# Patient Record
Sex: Male | Born: 1975 | Race: Asian | Hispanic: No | Marital: Married | Smoking: Never smoker
Health system: Southern US, Community
[De-identification: ages and names within clinical notes are randomized; demographics above are authoritative.]

## PROBLEM LIST (undated history)

## (undated) DIAGNOSIS — M542 Cervicalgia: Secondary | ICD-10-CM

## (undated) DIAGNOSIS — Z86010 Personal history of colonic polyps: Secondary | ICD-10-CM

## (undated) DIAGNOSIS — E119 Type 2 diabetes mellitus without complications: Secondary | ICD-10-CM

## (undated) DIAGNOSIS — R569 Unspecified convulsions: Secondary | ICD-10-CM

## (undated) DIAGNOSIS — I2510 Atherosclerotic heart disease of native coronary artery without angina pectoris: Secondary | ICD-10-CM

## (undated) DIAGNOSIS — R1032 Left lower quadrant pain: Secondary | ICD-10-CM

## (undated) DIAGNOSIS — N200 Calculus of kidney: Secondary | ICD-10-CM

## (undated) DIAGNOSIS — K219 Gastro-esophageal reflux disease without esophagitis: Secondary | ICD-10-CM

## (undated) DIAGNOSIS — R1319 Other dysphagia: Secondary | ICD-10-CM

## (undated) DIAGNOSIS — R7302 Impaired glucose tolerance (oral): Secondary | ICD-10-CM

## (undated) DIAGNOSIS — N529 Male erectile dysfunction, unspecified: Secondary | ICD-10-CM

## (undated) DIAGNOSIS — R030 Elevated blood-pressure reading, without diagnosis of hypertension: Secondary | ICD-10-CM

## (undated) DIAGNOSIS — E785 Hyperlipidemia, unspecified: Secondary | ICD-10-CM

## (undated) DIAGNOSIS — K921 Melena: Secondary | ICD-10-CM

## (undated) DIAGNOSIS — R51 Headache: Secondary | ICD-10-CM

## (undated) DIAGNOSIS — J309 Allergic rhinitis, unspecified: Secondary | ICD-10-CM

## (undated) HISTORY — DX: Atherosclerotic heart disease of native coronary artery without angina pectoris: I25.10

## (undated) HISTORY — DX: Male erectile dysfunction, unspecified: N52.9

## (undated) HISTORY — DX: Hyperlipidemia, unspecified: E78.5

## (undated) HISTORY — DX: Headache: R51

## (undated) HISTORY — DX: Left lower quadrant pain: R10.32

## (undated) HISTORY — PX: OTHER SURGICAL HISTORY: SHX169

## (undated) HISTORY — DX: Other dysphagia: R13.19

## (undated) HISTORY — DX: Elevated blood-pressure reading, without diagnosis of hypertension: R03.0

## (undated) HISTORY — PX: NASAL FRACTURE SURGERY: SHX718

## (undated) HISTORY — DX: Cervicalgia: M54.2

## (undated) HISTORY — PX: ANAL FISSURE REPAIR: SHX2312

## (undated) HISTORY — DX: Unspecified convulsions: R56.9

## (undated) HISTORY — DX: Impaired glucose tolerance (oral): R73.02

## (undated) HISTORY — DX: Melena: K92.1

## (undated) HISTORY — PX: WISDOM TOOTH EXTRACTION: SHX21

## (undated) HISTORY — DX: Personal history of colonic polyps: Z86.010

## (undated) HISTORY — DX: Gastro-esophageal reflux disease without esophagitis: K21.9

## (undated) HISTORY — DX: Allergic rhinitis, unspecified: J30.9

## (undated) HISTORY — DX: Calculus of kidney: N20.0

---

## 2003-04-13 ENCOUNTER — Encounter (INDEPENDENT_AMBULATORY_CARE_PROVIDER_SITE_OTHER): Payer: Self-pay | Admitting: Specialist

## 2003-04-13 ENCOUNTER — Ambulatory Visit (HOSPITAL_BASED_OUTPATIENT_CLINIC_OR_DEPARTMENT_OTHER): Admission: RE | Admit: 2003-04-13 | Discharge: 2003-04-13 | Payer: Self-pay | Admitting: General Surgery

## 2003-04-13 ENCOUNTER — Encounter (INDEPENDENT_AMBULATORY_CARE_PROVIDER_SITE_OTHER): Payer: Self-pay | Admitting: *Deleted

## 2003-04-13 ENCOUNTER — Ambulatory Visit (HOSPITAL_COMMUNITY): Admission: RE | Admit: 2003-04-13 | Discharge: 2003-04-13 | Payer: Self-pay | Admitting: General Surgery

## 2004-12-26 ENCOUNTER — Ambulatory Visit: Payer: Self-pay | Admitting: Internal Medicine

## 2006-01-01 ENCOUNTER — Ambulatory Visit (HOSPITAL_BASED_OUTPATIENT_CLINIC_OR_DEPARTMENT_OTHER): Admission: RE | Admit: 2006-01-01 | Discharge: 2006-01-01 | Payer: Self-pay | Admitting: Otolaryngology

## 2006-05-09 ENCOUNTER — Ambulatory Visit: Payer: Self-pay | Admitting: Internal Medicine

## 2006-05-09 LAB — CONVERTED CEMR LAB
Albumin: 4.1 g/dL (ref 3.5–5.2)
Alkaline Phosphatase: 55 units/L (ref 39–117)
BUN: 8 mg/dL (ref 6–23)
Basophils Absolute: 0 10*3/uL (ref 0.0–0.1)
Bilirubin Urine: NEGATIVE
Cholesterol: 219 mg/dL (ref 0–200)
Direct LDL: 154.5 mg/dL
Eosinophils Absolute: 0.3 10*3/uL (ref 0.0–0.6)
GFR calc Af Amer: 145 mL/min
HDL: 42.3 mg/dL (ref >39.0)
Hemoglobin, Urine: NEGATIVE
Hemoglobin: 13.3 g/dL (ref 13.0–17.0)
Leukocytes, UA: NEGATIVE
Lymphocytes Relative: 32.2 % (ref 12.0–46.0)
MCHC: 34.5 g/dL (ref 30.0–36.0)
MCV: 87.5 fL (ref 78.0–100.0)
Monocytes Absolute: 0.3 10*3/uL (ref 0.2–0.7)
Monocytes Relative: 7 % (ref 3.0–11.0)
Neutro Abs: 1.8 10*3/uL (ref 1.4–7.7)
Potassium: 4.3 meq/L (ref 3.5–5.1)
TSH: 0.96 microintl units/mL (ref 0.35–5.50)
VLDL: 38 mg/dL (ref 0–40)
pH: 7 (ref 5.0–8.0)

## 2006-05-15 ENCOUNTER — Ambulatory Visit: Payer: Self-pay | Admitting: Internal Medicine

## 2007-03-21 ENCOUNTER — Ambulatory Visit: Payer: Self-pay | Admitting: Internal Medicine

## 2007-03-21 DIAGNOSIS — E785 Hyperlipidemia, unspecified: Secondary | ICD-10-CM

## 2007-03-21 DIAGNOSIS — R569 Unspecified convulsions: Secondary | ICD-10-CM

## 2007-03-21 HISTORY — DX: Unspecified convulsions: R56.9

## 2007-03-21 HISTORY — DX: Hyperlipidemia, unspecified: E78.5

## 2007-10-22 ENCOUNTER — Ambulatory Visit: Payer: Self-pay | Admitting: Internal Medicine

## 2007-10-22 DIAGNOSIS — K219 Gastro-esophageal reflux disease without esophagitis: Secondary | ICD-10-CM

## 2007-10-22 DIAGNOSIS — R030 Elevated blood-pressure reading, without diagnosis of hypertension: Secondary | ICD-10-CM | POA: Insufficient documentation

## 2007-10-22 HISTORY — DX: Gastro-esophageal reflux disease without esophagitis: K21.9

## 2007-10-22 HISTORY — DX: Elevated blood-pressure reading, without diagnosis of hypertension: R03.0

## 2007-10-23 LAB — CONVERTED CEMR LAB
Albumin: 4.4 g/dL (ref 3.5–5.2)
BUN: 9 mg/dL (ref 6–23)
Bilirubin Urine: NEGATIVE
Calcium: 9.2 mg/dL (ref 8.4–10.5)
Cholesterol: 177 mg/dL (ref 0–200)
Creatinine, Ser: 0.8 mg/dL (ref 0.4–1.5)
Eosinophils Absolute: 0.2 10*3/uL (ref 0.0–0.7)
Eosinophils Relative: 3.7 % (ref 0.0–5.0)
GFR calc Af Amer: 144 mL/min
GFR calc non Af Amer: 119 mL/min
Glucose, Bld: 115 mg/dL — ABNORMAL HIGH (ref 70–99)
HCT: 45.6 % (ref 39.0–52.0)
HDL: 40.8 mg/dL (ref >39.0)
Hemoglobin: 15.3 g/dL (ref 13.0–17.0)
Hgb A1c MFr Bld: 5.8 % (ref 4.6–6.0)
MCV: 88.4 fL (ref 78.0–100.0)
Monocytes Absolute: 0.2 10*3/uL (ref 0.1–1.0)
Monocytes Relative: 3.3 % (ref 3.0–12.0)
Neutro Abs: 3.7 10*3/uL (ref 1.4–7.7)
Platelets: 227 10*3/uL (ref 150–400)
Potassium: 4.4 meq/L (ref 3.5–5.1)
TSH: 0.98 microintl units/mL (ref 0.35–5.50)
Total Protein, Urine: NEGATIVE mg/dL
Total Protein: 7.3 g/dL (ref 6.0–8.3)
Urine Glucose: NEGATIVE mg/dL
VLDL: 20 mg/dL (ref 0–40)
WBC: 5.2 10*3/uL (ref 4.5–10.5)
pH: 5.5 (ref 5.0–8.0)

## 2007-12-12 ENCOUNTER — Ambulatory Visit: Payer: Self-pay | Admitting: Internal Medicine

## 2008-01-26 ENCOUNTER — Ambulatory Visit: Payer: Self-pay | Admitting: Internal Medicine

## 2008-01-26 DIAGNOSIS — N529 Male erectile dysfunction, unspecified: Secondary | ICD-10-CM

## 2008-01-26 HISTORY — DX: Male erectile dysfunction, unspecified: N52.9

## 2008-01-28 LAB — CONVERTED CEMR LAB
ALT: 17 units/L (ref 0–53)
AST: 22 units/L (ref 0–37)
Basophils Relative: 0.7 % (ref 0.0–3.0)
Bilirubin Urine: NEGATIVE
Bilirubin, Direct: 0.1 mg/dL (ref 0.0–0.3)
CO2: 30 meq/L (ref 19–32)
Chloride: 101 meq/L (ref 96–112)
Creatinine, Ser: 0.9 mg/dL (ref 0.4–1.5)
Glucose, Bld: 106 mg/dL — ABNORMAL HIGH (ref 70–99)
Ketones, ur: NEGATIVE mg/dL
Leukocytes, UA: NEGATIVE
Lymphocytes Relative: 24.6 % (ref 12.0–46.0)
MCHC: 33.8 g/dL (ref 30.0–36.0)
MCV: 86.2 fL (ref 78.0–100.0)
Neutrophils Relative %: 63.9 % (ref 43.0–77.0)
RBC: 5.29 M/uL (ref 4.22–5.81)
RDW: 13.6 % (ref 11.5–14.6)
Sodium: 140 meq/L (ref 135–145)
Specific Gravity, Urine: 1.025 (ref 1.000–1.03)
Total Bilirubin: 1.2 mg/dL (ref 0.3–1.2)
Total Protein: 7.4 g/dL (ref 6.0–8.3)
Urine Glucose: NEGATIVE mg/dL
Urobilinogen, UA: 0.2 (ref 0.0–1.0)

## 2008-02-09 ENCOUNTER — Telehealth (INDEPENDENT_AMBULATORY_CARE_PROVIDER_SITE_OTHER): Payer: Self-pay | Admitting: *Deleted

## 2008-10-29 ENCOUNTER — Ambulatory Visit: Payer: Self-pay | Admitting: Internal Medicine

## 2008-10-29 DIAGNOSIS — R1032 Left lower quadrant pain: Secondary | ICD-10-CM

## 2008-10-29 DIAGNOSIS — J309 Allergic rhinitis, unspecified: Secondary | ICD-10-CM

## 2008-10-29 HISTORY — DX: Left lower quadrant pain: R10.32

## 2008-10-29 HISTORY — DX: Allergic rhinitis, unspecified: J30.9

## 2008-11-05 ENCOUNTER — Ambulatory Visit: Payer: Self-pay | Admitting: Internal Medicine

## 2008-11-05 ENCOUNTER — Telehealth: Payer: Self-pay | Admitting: Internal Medicine

## 2008-11-05 LAB — CONVERTED CEMR LAB
Albumin: 4 g/dL (ref 3.5–5.2)
Alkaline Phosphatase: 50 units/L (ref 39–117)
BUN: 14 mg/dL (ref 6–23)
Calcium: 9.4 mg/dL (ref 8.4–10.5)
Cholesterol: 191 mg/dL (ref 0–200)
Creatinine, Ser: 0.8 mg/dL (ref 0.4–1.5)
Eosinophils Relative: 3.3 % (ref 0.0–5.0)
GFR calc non Af Amer: 117.81 mL/min (ref >60)
Hemoglobin, Urine: NEGATIVE
LDL Cholesterol: 127 mg/dL — ABNORMAL HIGH (ref 0–99)
Lymphocytes Relative: 21.2 % (ref 12.0–46.0)
Monocytes Relative: 2.3 % — ABNORMAL LOW (ref 3.0–12.0)
Neutrophils Relative %: 73.1 % (ref 43.0–77.0)
Platelets: 272 10*3/uL (ref 150.0–400.0)
TSH: 0.74 microintl units/mL (ref 0.35–5.50)
Total CHOL/HDL Ratio: 6
Total Protein: 7.7 g/dL (ref 6.0–8.3)
Triglycerides: 166 mg/dL — ABNORMAL HIGH (ref 0.0–149.0)
Urine Glucose: NEGATIVE mg/dL
Urobilinogen, UA: 0.2 (ref 0.0–1.0)
VLDL: 33.2 mg/dL (ref 0.0–40.0)
WBC: 7.7 10*3/uL (ref 4.5–10.5)

## 2008-11-08 LAB — CONVERTED CEMR LAB: Vit D, 25-Hydroxy: 17 ng/mL — ABNORMAL LOW (ref 30–89)

## 2008-11-10 ENCOUNTER — Ambulatory Visit: Payer: Self-pay | Admitting: Internal Medicine

## 2008-11-30 ENCOUNTER — Telehealth: Payer: Self-pay | Admitting: Internal Medicine

## 2008-11-30 ENCOUNTER — Encounter (INDEPENDENT_AMBULATORY_CARE_PROVIDER_SITE_OTHER): Payer: Self-pay | Admitting: *Deleted

## 2008-11-30 DIAGNOSIS — K921 Melena: Secondary | ICD-10-CM

## 2008-11-30 HISTORY — DX: Melena: K92.1

## 2008-12-13 HISTORY — PX: COLONOSCOPY: SHX174

## 2008-12-28 ENCOUNTER — Ambulatory Visit: Payer: Self-pay | Admitting: Gastroenterology

## 2008-12-28 DIAGNOSIS — Z860100 Personal history of colon polyps, unspecified: Secondary | ICD-10-CM | POA: Insufficient documentation

## 2008-12-28 HISTORY — DX: Personal history of colonic polyps: Z86.010

## 2008-12-28 HISTORY — DX: Personal history of colon polyps, unspecified: Z86.0100

## 2008-12-29 ENCOUNTER — Ambulatory Visit: Payer: Self-pay | Admitting: Gastroenterology

## 2009-05-02 ENCOUNTER — Ambulatory Visit: Payer: Self-pay | Admitting: Internal Medicine

## 2009-05-02 DIAGNOSIS — R51 Headache: Secondary | ICD-10-CM | POA: Insufficient documentation

## 2009-05-02 DIAGNOSIS — J069 Acute upper respiratory infection, unspecified: Secondary | ICD-10-CM | POA: Insufficient documentation

## 2009-05-02 DIAGNOSIS — R519 Headache, unspecified: Secondary | ICD-10-CM | POA: Insufficient documentation

## 2009-05-02 DIAGNOSIS — R1319 Other dysphagia: Secondary | ICD-10-CM

## 2009-05-02 HISTORY — DX: Other dysphagia: R13.19

## 2009-05-02 HISTORY — DX: Headache: R51

## 2009-12-15 ENCOUNTER — Ambulatory Visit: Payer: Self-pay | Admitting: Internal Medicine

## 2009-12-15 DIAGNOSIS — M542 Cervicalgia: Secondary | ICD-10-CM | POA: Insufficient documentation

## 2009-12-15 HISTORY — DX: Cervicalgia: M54.2

## 2009-12-20 ENCOUNTER — Telehealth: Payer: Self-pay | Admitting: Internal Medicine

## 2010-01-31 ENCOUNTER — Ambulatory Visit: Payer: Self-pay | Admitting: Internal Medicine

## 2010-02-03 LAB — CONVERTED CEMR LAB
AST: 20 units/L (ref 0–37)
Albumin: 4.2 g/dL (ref 3.5–5.2)
Alkaline Phosphatase: 44 units/L (ref 39–117)
Basophils Absolute: 0 10*3/uL (ref 0.0–0.1)
Bilirubin Urine: NEGATIVE
Blood, UA: NEGATIVE
Calcium: 9.3 mg/dL (ref 8.4–10.5)
GFR calc non Af Amer: 106.15 mL/min (ref >60.00)
Glucose, Bld: 98 mg/dL (ref 70–99)
HDL: 42.8 mg/dL (ref >39.00)
Hemoglobin: 14.6 g/dL (ref 13.0–17.0)
LDL Cholesterol: 113 mg/dL — ABNORMAL HIGH (ref 0–99)
Leukocytes, UA: NEGATIVE
Lymphocytes Relative: 30.9 % (ref 12.0–46.0)
Monocytes Relative: 7.6 % (ref 3.0–12.0)
Neutro Abs: 2.1 10*3/uL (ref 1.4–7.7)
Nitrite: NEGATIVE
RBC: 4.85 M/uL (ref 4.22–5.81)
RDW: 14.2 % (ref 11.5–14.6)
Sodium: 138 meq/L (ref 135–145)
Total CHOL/HDL Ratio: 4
Urobilinogen, UA: 0.2 (ref 0.0–1.0)
VLDL: 35 mg/dL (ref 0.0–40.0)
pH: 6 (ref 5.0–8.0)

## 2010-02-07 ENCOUNTER — Ambulatory Visit: Payer: Self-pay | Admitting: Internal Medicine

## 2010-03-14 NOTE — Assessment & Plan Note (Signed)
Summary: SEVERE HEADACHES-LB   Vital Signs:  Patient profile:   35 year old male Height:      69 inches Weight:      204 pounds BMI:     30.23 O2 Sat:      97 % on Room air Temp:     98.5 degrees F oral Pulse rate:   78 / minute BP sitting:   120 / 82  (left arm) Cuff size:   regular  Vitals Entered By: Zella Ball Ewing CMA Duncan Dull) (December 15, 2009 3:46 PM)  O2 Flow:  Room air CC: Headaches/RE   CC:  Headaches/RE.  History of Present Illness: here for f/u - worried about BP since he has daily headaches fo at least 45 min for 2 months straight,  starts in upper post neck, radiates to the crown,  mod to severe, has normal  BP at home, stopped coffee, tried new pillow for bed;  not taking OTC tylenol or nsaids (no dialy overuse or rebound HA) but pain simply cont's to recur; dull but no throbbing, blurred vision, dizziness and not assoc with n/v, photophobia or worse with moving the head and neck or other exertion such as stairs. 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  Wife with HTN but no headaches,  and he thinks no problem with car or home carbon monoxide.  No injury or falls. Pt denies new neuro symptoms such as  facial or extremity weakness..  Does a lot of work on the cell phone,.  No other ENT symtpoms such as earache, ST, dysphagia.  No siezure for over 14 yrs,  off meds for 4 yrs after last saw Dr Perry Mount.  Also finished his vit d 50K per wk regimen for 3 mo  Problems Prior to Update: 1)  Neck Pain  (ICD-723.1) 2)  Other Dysphagia  (ICD-787.29) 3)  Headache  (ICD-784.0) 4)  Uri  (ICD-465.9) 5)  Personal History of Colonic Polyps  (ICD-V12.72) 6)  Hematochezia  (ICD-578.1) 7)  Preventive Health Care   (ICD-V70.0) 8)  Allergic Rhinitis  (ICD-477.9) 9)  Abdominal Pain, Left Lower Quadrant  (ICD-789.04) 10)  Stress Disorder  (ICD-V62.89) 11)  Erectile Dysfunction  (ICD-607.84) 12)  Elevated Blood Pressure Without Diagnosis of Hypertension  (ICD-796.2) 13)  Sexually Transmitted Disease, Exposure To  (ICD-V01.6) 14)  Gerd  (ICD-530.81) 15)  Preventive Health Care  (ICD-V70.0) 16)  Seizure Disorder  (ICD-780.39) 17)  Hyperlipidemia  (ICD-272.4)  Medications Prior to Update: 1)  Lotrisone 1-0.05 % Crea (Clotrimazole-Betamethasone) .... Use Two Times A Day Prn 2)  Viagra 100 Mg Tabs (Sildenafil Citrate) .Marland Kitchen.. 1 By Mouth Once Daily As Needed 3)  Fexofenadine Hcl 180 Mg Tabs (Fexofenadine Hcl) .Marland Kitchen.. 1 By Mouth Once Daily As Needed 4)  Singulair 10 Mg Tabs (Montelukast Sodium) .... Take 1 Tablet By Mouth Once A Day As Needed 5)  Vitamin D (Ergocalciferol) 50000 Unit Caps (Ergocalciferol) .Marland Kitchen.. 1 Tablet By Mouth Weekly 6)  Azithromycin 250 Mg Tabs (Azithromycin) .... 2po Qd For 1 Day, Then 1po Qd For 4days, Then Stop 7)  Omeprazole 20 Mg Cpdr (Omeprazole) .... 2po Once Daily  Current Medications (verified): 1)  Lotrisone 1-0.05 %  Crea (Clotrimazole-Betamethasone) .... Use Two Times A Day Prn 2)  Fexofenadine Hcl 180 Mg Tabs (Fexofenadine Hcl) .Marland Kitchen.. 1 By Mouth Once Daily As Needed 3)  Singulair 10 Mg Tabs (Montelukast Sodium) .... Take 1 Tablet By Mouth Once A Day As Needed 4)  Vitamin D 2000 Unit Tabs (Cholecalciferol) .Marland Kitchen.. 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  Risk Factors: Exercise: yes (12/28/2008)  Risk Factors: Smoking Status: never (03/21/2007)  Review of Systems       all otherwise negative per pt -    Physical Exam  General:  alert and overweight-appearing.  Head:  normocephalic and atraumatic.   Eyes:  vision grossly intact, pupils equal, and pupils round.   Ears:  R ear normal and L ear normal.   Nose:  no external deformity and no nasal discharge.   Mouth:  no gingival abnormalities and pharynx pink and moist.   Neck:  supple, full ROM, no masses, no thyromegaly, and no thyroid nodules or tenderness.   Lungs:  Normal respiratory effort, chest expands symmetrically. Lungs are clear to auscultation, no crackles or wheezes. Heart:  Normal rate and regular rhythm. S1 and S2 normal without gallop, murmur, click, rub or other extra sounds. Extremities:  no edema, no erythema  Neurologic:  No cranial nerve deficits noted. Station and gait are normal. Plantar reflexes are down-going bilaterally. DTRs are symmetrical throughout. Sensory, motor and coordinative functions appear intact. Skin:  color normal and no rashes.   Psych:  not depressed appearing and slightly anxious.     Impression & Recommendations:  Problem # 1:  HEADACHE (ICD-784.0) unclear etiology, atypical for migraine or tension/msk; dialy persistent recurrent for 2 mo - will need MRI brain, and consider neuro eval Orders: Radiology Referral (Radiology)  Problem # 2:  NECK PAIN (ICD-723.1) pain seems assoc with upper cervical area as well- for c-spine films , exam benign today Orders: T-Cervical Spine Comp 4 Views (72050TC)  Problem # 3:  ELEVATED BLOOD PRESSURE WITHOUT DIAGNOSIS OF HYPERTENSION (ICD-796.2)  BP today: 120/82 Prior BP: 108/70 (05/02/2009)  Labs Reviewed: Creat: 0.8 (11/05/2008) Chol: 191 (11/05/2008)   HDL: 30.50 (11/05/2008)   LDL: 127 (11/05/2008)   TG: 166.0  (11/05/2008)  Instructed in low sodium diet (DASH Handout) and behavior modification.   No evidence sustained essential HTN - ok to cont to monitor  Problem # 4:  SEIZURE DISORDER (ICD-780.39) off tegretol for 4 yrs after last saw Dr Perry Mount; no recurrence, ok to follow  Complete Medication List: 1)  Lotrisone 1-0.05 % Crea (Clotrimazole-betamethasone) .... Use two times a day prn 2)  Fexofenadine Hcl 180 Mg Tabs (Fexofenadine hcl) .Marland Kitchen.. 1 by mouth once daily as needed 3)  Singulair 10 Mg Tabs (Montelukast sodium) .... Take 1 tablet by mouth once a day as needed 4)  Vitamin D 2000 Unit Tabs (Cholecalciferol) .Marland Kitchen.. 1po once daily  Patient Instructions: 1)  Please go to Radiology in the basement level for your X-Ray today  2)  You will be contacted about the referral(s) to: MRI Head 3)  Please call  the number on the [facility] Card for results of your testing 4)  You are given the refills today 5)  Please change the vit d to 2000 units per day 6)  Please schedule a follow-up appointment as you have planned Prescriptions: SINGULAIR 10 MG TABS (MONTELUKAST SODIUM) Take 1 tablet by mouth once a day as needed  #90 x 3   Entered and Authorized by:   Corwin Levins MD   Signed by:   Corwin Levins MD on 12/15/2009   Method used:   Print then Give to Patient   RxID:   [id number] FEXOFENADINE HCL 180 MG TABS (FEXOFENADINE HCL) 1 by mouth once daily as needed  #90 x 3   Entered and Authorized by:   Corwin Levins MD   Signed by:   Corwin Levins MD on 12/15/2009   Method used:   Print then Give to Patient   RxID:   [id number] LOTRISONE 1-0.05 % CREA (CLOTRIMAZOLE-BETAMETHASONE) use two times a day prn  #1large x 1   Entered and Authorized by:   Corwin Levins MD   Signed by:   Corwin Levins MD on 12/15/2009   Method used:   Print then Give to Patient   RxID:   [id number]    Orders Added: 1)  Radiology Referral [Radiology] 2)  T-Cervical Spine Comp 4 Views [72050TC] 3)  Est. Patient  Level IV [56433]

## 2010-03-14 NOTE — Assessment & Plan Note (Signed)
Summary: PROBLEMS W/THROAT, GETING WORSE/CD   Vital Signs:  Patient profile:   35 year old male Height:      70 inches Weight:      203.13 pounds BMI:     29.25 O2 Sat:      97 % on Room air Temp:     98.4 degrees F oral Pulse rate:   78 / minute BP sitting:   108 / 70  (left arm) Cuff size:   large  Vitals Entered ByZella Ball Ewing (May 02, 2009 3:41 PM)  O2 Flow:  Room air CC: throat discomfort, labs, headaches/RE   CC:  throat discomfort, labs, and headaches/RE.  History of Present Illness: here with acute onset 2 to 3 wks onset problem with some solid food seems to be stuck in the back of the throat;  even an hour later after eating , and after fluids still gets the sensation even if not eaten again;  no ST, fever , odynophagia, n/v, abd pain, blood, or wt loss.  Seemed to start soon after a URI that seemd to be better.  No sinus problem , or ear problem or vertigo. Also mentions throbbing headaches  now daily it seems for about 10 days.  No increasd stress;  Takes alleve on occasion and seems to work; overall mild, occas severe but no photophobia or vomiting.  No significant HA issue in the past  Problems Prior to Update: 1)  Other Dysphagia  (ICD-787.29) 2)  Headache  (ICD-784.0) 3)  Uri  (ICD-465.9) 4)  Personal History of Colonic Polyps  (ICD-V12.72) 5)  Hematochezia  (ICD-578.1) 6)  Preventive Health Care  (ICD-V70.0) 7)  Allergic Rhinitis  (ICD-477.9) 8)  Abdominal Pain, Left Lower Quadrant  (ICD-789.04) 9)  Stress Disorder  (ICD-V62.89) 10)  Erectile Dysfunction  (ICD-607.84) 11)  Elevated Blood Pressure Without Diagnosis of Hypertension  (ICD-796.2) 12)  Sexually Transmitted Disease, Exposure To  (ICD-V01.6) 13)  Gerd  (ICD-530.81) 14)  Preventive Health Care  (ICD-V70.0) 15)  Seizure Disorder  (ICD-780.39) 16)  Hyperlipidemia  (ICD-272.4)  Medications Prior to Update: 1)  Lotrisone 1-0.05 % Crea (Clotrimazole-Betamethasone) .... Use Two Times A Day Prn 2)   Viagra 100 Mg Tabs (Sildenafil Citrate) .Marland Kitchen.. 1 By Mouth Once Daily As Needed 3)  Fexofenadine Hcl 180 Mg Tabs (Fexofenadine Hcl) .Marland Kitchen.. 1 By Mouth Once Daily As Needed 4)  Singulair 10 Mg Tabs (Montelukast Sodium) .... Take 1 Tablet By Mouth Once A Day As Needed 5)  Vitamin D (Ergocalciferol) 50000 Unit Caps (Ergocalciferol) .Marland Kitchen.. 1 Tablet By Mouth Weekly  Current Medications (verified): 1)  Lotrisone 1-0.05 % Crea (Clotrimazole-Betamethasone) .... Use Two Times A Day Prn 2)  Viagra 100 Mg Tabs (Sildenafil Citrate) .Marland Kitchen.. 1 By Mouth Once Daily As Needed 3)  Fexofenadine Hcl 180 Mg Tabs (Fexofenadine Hcl) .Marland Kitchen.. 1 By Mouth Once Daily As Needed 4)  Singulair 10 Mg Tabs (Montelukast Sodium) .... Take 1 Tablet By Mouth Once A Day As Needed 5)  Vitamin D (Ergocalciferol) 50000 Unit Caps (Ergocalciferol) .Marland Kitchen.. 1 Tablet By Mouth Weekly 6)  Azithromycin 250 Mg Tabs (Azithromycin) .... 2po Qd For 1 Day, Then 1po Qd For 4days, Then Stop 7)  Omeprazole 20 Mg Cpdr (Omeprazole) .... 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  Risk Factors: Exercise: yes (12/28/2008)  Risk Factors: Smoking Status: never (03/21/2007)  Review of Systems       all otherwise negative per pt -    Physical Exam  General:  alert and overweight-appearing.  , mild ill  Head:  normocephalic and atraumatic.   Eyes:  vision grossly intact, pupils equal, and pupils round.   Ears:  bilat tm's red, sinus nontender Nose:   nasal dischargemucosal pallor and mucosal edema.   Mouth:  pharyngeal erythema and fair dentition.   Neck:  supple and cervical lymphadenopathy.   Lungs:  normal respiratory effort and normal breath sounds.   Heart:  normal rate and regular rhythm.   Abdomen:  soft and normal bowel sounds.  and mild epigasttric tender without guarding or rebound Extremities:  no edema, no erythema  Neurologic:  cranial nerves II-XII intact, strength normal in all extremities, sensation intact to light touch, and DTRs symmetrical and normal.     Impression & Recommendations:  Problem # 1:  URI (ICD-465.9)  His updated medication list for this problem includes:    Fexofenadine Hcl 180 Mg Tabs (Fexofenadine hcl) .Marland Kitchen... 1 by mouth once daily as needed treat as above, f/u any worsening signs or symptoms   Problem # 2:  GERD (ICD-530.81)  His updated medication list for this problem includes:    Omeprazole 20 Mg Cpdr (Omeprazole) .Marland Kitchen... 2po once daily treat as above, f/u any worsening signs or symptoms   Problem # 3:  HEADACHE (ICD-784.0) suspect related to #1 - ok to follow, if persists consider MRI for atypical onset daily headache  Problem # 4:  OTHER DYSPHAGIA (ICD-787.29) suspect related to #1 and/or #2 above - ok to follow for now, consider GI referral if persists  Complete Medication List: 1)  Lotrisone 1-0.05 % Crea (Clotrimazole-betamethasone) .... Use two times a day prn 2)  Viagra 100 Mg Tabs (Sildenafil citrate) .Marland Kitchen.. 1 by mouth once daily as needed 3)  Fexofenadine Hcl 180 Mg Tabs (Fexofenadine hcl) .Marland Kitchen.. 1 by mouth once daily as needed 4)  Singulair 10 Mg Tabs (Montelukast sodium) .... Take 1 tablet by mouth once a day as needed 5)  Vitamin D (ergocalciferol) 50000 Unit Caps (Ergocalciferol) .Marland Kitchen.. 1 tablet by mouth weekly 6)  Azithromycin 250 Mg Tabs (Azithromycin) .... 2po qd for 1 day, then 1po qd for 4days, then stop 7)  Omeprazole 20 Mg Cpdr (Omeprazole) .... 2po once daily  Other  Orders: T-Vitamin D (25-Hydroxy) [phone])  Patient Instructions: 1)  Please go to the Lab in the basement for your blood  tests today  - the vit d 2)  Please take all new medications as prescribed - the antibiotic was sent to your pharmacy 3)  Continue all previous medications as before this visit 4)  Please return if the headache, or the trouble swallowing keeps up or gets worse 5)  Please schedule a follow-up appointment as needed. Prescriptions: OMEPRAZOLE 20 MG CPDR (OMEPRAZOLE) 2po once daily  #60 x 11   Entered and Authorized by:   Corwin Levins MD   Signed by:   Corwin Levins MD on 05/02/2009   Method used:   Electronically to        [facility] [address] (retail)  [address]       [place], [place]  [address]       Ph: [phone] or [phone]       Fax: [phone]   RxID:   [id number] AZITHROMYCIN 250 MG TABS (AZITHROMYCIN) 2po qd for 1 day, then 1po qd for 4days, then stop  #6 x 1   Entered and Authorized by:   Corwin Levins MD   Signed by:   Corwin Levins MD on 05/02/2009   Method used:   Electronically to        [facility] [address] (retail)       [address]       [place], [place]  [address]       Ph: [phone] or [phone]       Fax: [phone]   RxID:   [id number] OMEPRAZOLE 20 MG CPDR (OMEPRAZOLE) 2po once daily  #60 x 11   Entered and Authorized by:   Corwin Levins MD   Signed by:   Corwin Levins MD on 05/02/2009   Method used:   Electronically to        [facility] [phone]* (retail)       [address]       [place], [place]  [address]       Ph: [phone] or [phone]       Fax: [phone]   RxID:   [id number] AZITHROMYCIN 250 MG TABS (AZITHROMYCIN) 2po qd for 1 day, then 1po qd for 4days, then stop  #6 x 1   Entered and Authorized by:   Corwin Levins MD   Signed by:   Corwin Levins MD on 05/02/2009    Method used:   Electronically to        [facility] [phone]* (retail)       [address]       [place], [place]  [address]       Ph: [phone] or [phone]       Fax: [phone]   RxID:   [id number]

## 2010-03-14 NOTE — Progress Notes (Signed)
Summary: MRI Referral   Phone Note Other Incoming   Summary of Call: Dr Ida Rogue  insurance denied MRI Brain without contrast. Initial call taken by: Dagoberto Reef,  December 20, 2009 10:26 AM  Follow-up for Phone Call        dahlia to inform pt -   As I warned pt in the office, the insurance denied the head MRI  does he wish referral to neurology? Follow-up by: Corwin Levins MD,  December 20, 2009 1:30 PM  Additional Follow-up for Phone Call Additional follow up Details #1::        Pt advised of above and states he will call back tomorrow after he has OV with Ortho to inform if he wants referral to Neuro Additional Follow-up by: Margaret Pyle, CMA,  December 20, 2009 1:49 PM    Additional Follow-up for Phone Call Additional follow up Details #2::    Pt didi not call back Follow-up by: Margaret Pyle, CMA,  December 26, 2009 9:24 AM

## 2010-03-16 NOTE — Assessment & Plan Note (Signed)
Summary: PER PT PHYSICAL  STC   Vital Signs:  Patient profile:   35 year old male Height:      69 inches Weight:      203.38 pounds BMI:     30.14 O2 Sat:      97 % on Room air Temp:     99.3 degrees F oral Pulse rate:   95 / minute BP sitting:   122 / 62  (left arm) Cuff size:   large  Vitals Entered By: Zella Ball Ewing CMA Duncan Dull) (February 07, 2010 10:42 AM)  O2 Flow:  Room air  CC: ADult Physical/RE   CC:  ADult Physical/RE.  History of Present Illness: here for wellness, Pt denies CP, worsening sob, doe, wheezing, orthopnea, pnd, worsening LE edema, palps, dizziness or syncope  Pt denies new neuro symptoms such as headache, facial or extremity weakness  Pt denies polydipsia, polyuria   Overall good compliance with meds, trying to follow low chol diet, wt stable, little excercise however .  Denies worsening depressive symptoms, suicidal ideation, or panic.   No fever, wt loss, night sweats, loss of appetite or other constitutional symptoms  Overall good compliance with meds, and good tolerability.  Pt states good ability with ADL's, low fall risk, home safety reviewed and adequate, no significant change in hearing or vision, trying to follow lower chol diet, and does 25 min cardio excercise every AM.    Problems Prior to Update: 1)  Neck Pain  (ICD-723.1) 2)  Other Dysphagia  (ICD-787.29) 3)  Headache  (ICD-784.0) 4)  Uri  (ICD-465.9) 5)  Personal History of Colonic Polyps  (ICD-V12.72) 6)  Hematochezia  (ICD-578.1) 7)  Preventive Health Care  (ICD-V70.0) 8)  Allergic Rhinitis  (ICD-477.9) 9)  Abdominal Pain, Left Lower Quadrant  (ICD-789.04) 10)  Stress Disorder  (ICD-V62.89) 11)  Erectile Dysfunction  (ICD-607.84) 12)  Elevated Blood Pressure Without Diagnosis of Hypertension  (ICD-796.2) 13)  Sexually Transmitted Disease, Exposure To  (ICD-V01.6) 14)  Gerd  (ICD-530.81) 15)  Preventive Health Care  (ICD-V70.0) 16)  Seizure Disorder  (ICD-780.39) 17)  Hyperlipidemia   (ICD-272.4)  Medications Prior to Update: 1)  Lotrisone 1-0.05 % Crea (Clotrimazole-Betamethasone) .... Use Two Times A Day Prn 2)  Fexofenadine Hcl 180 Mg Tabs (Fexofenadine Hcl) .Marland Kitchen.. 1 By Mouth Once Daily As Needed 3)  Singulair 10 Mg Tabs (Montelukast Sodium) .... Take 1 Tablet By Mouth Once A Day As Needed 4)  Vitamin D 2000 Unit Tabs (Cholecalciferol) .Marland Kitchen.. 1po Once Daily  Current Medications (verified): 1)  Lotrisone 1-0.05 % Crea (Clotrimazole-Betamethasone) .... Use Two Times A Day Prn 2)  Fexofenadine Hcl 180 Mg Tabs (Fexofenadine Hcl) .Marland Kitchen.. 1 By Mouth Once Daily As Needed 3)  Singulair 10 Mg Tabs (Montelukast Sodium) .... Take 1 Tablet By Mouth Once A Day As Needed 4)  Vitamin D 2000 Unit Tabs (Cholecalciferol) .Marland Kitchen.. 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  Risk Factors: Exercise: yes (12/28/2008)  Risk Factors:  Smoking Status: never (03/21/2007)  Review of Systems  The patient denies anorexia, fever, vision loss, decreased hearing, hoarseness, chest pain, syncope, dyspnea on exertion, peripheral edema, prolonged cough, headaches, hemoptysis, abdominal pain, melena, hematochezia, severe indigestion/heartburn, hematuria, muscle weakness, suspicious skin lesions, transient blindness, difficulty walking, depression, unusual weight change, abnormal bleeding, enlarged lymph  nodes, and angioedema.         all otherwise negative per pt -    Physical Exam  General:  alert and overweight-appearing.  Head:  normocephalic and atraumatic.   Eyes:  vision grossly intact, pupils equal, and pupils round.   Ears:  R ear normal and L ear normal.   Nose:  no external deformity and no nasal discharge.   Mouth:  no gingival abnormalities and pharynx pink and moist.   Neck:  supple, full ROM, no masses, no thyromegaly, and no thyroid nodules or tenderness.   Lungs:  Normal respiratory effort, chest expands symmetrically. Lungs are clear to auscultation, no crackles or wheezes. Heart:  Normal rate and regular rhythm. S1 and S2 normal without gallop, murmur, click, rub or other extra sounds. Abdomen:  soft and normal bowel sounds.  and mild epigasttric tender without guarding or rebound Msk:  no joint tenderness and no joint swelling.   Extremities:  no edema, no erythema  Neurologic:  strength normal in all extremities, sensation intact to light touch, gait normal, and DTRs symmetrical and normal.   Skin:  color normal and no rashes.   Psych:  not anxious appearing and not depressed appearing.     Impression & Recommendations:  Problem # 1:  Preventive Health Care (ICD-V70.0) Overall doing well, age appropriate education and counseling updated, referral for preventive services and immunizations addressed, dietary counseling and smoking status adressed , most recent labs reviewed I have personally reviewed and have noted 1.The patient's medical and social history 2.Their use of alcohol, tobacco or illicit drugs 3.Their current medications and supplements 4. Functional ability including ADL's, fall risk, home safety risk, hearing & visual impairment  5.Diet and physical activities 6.Evidence for depression or mood disorders The patients weight, height, BMI  have been recorded in the chart I have made referrals, counseling and provided education to the patient based  review of the above   Complete Medication List: 1)  Lotrisone 1-0.05 % Crea (Clotrimazole-betamethasone) .... Use two times a day prn 2)  Fexofenadine Hcl 180 Mg Tabs (Fexofenadine hcl) .Marland Kitchen.. 1 by mouth once daily as needed 3)  Singulair 10 Mg Tabs (Montelukast sodium) .... Take 1 tablet by mouth once a day as needed 4)  Vitamin D 2000 Unit Tabs (Cholecalciferol) .Marland Kitchen.. 1po once daily  Patient Instructions: 1)  Continue all previous medications as before this visit 2)  Please schedule a follow-up appointment in 1 year, or sooner if needed Prescriptions: LOTRISONE 1-0.05 % CREA (CLOTRIMAZOLE-BETAMETHASONE) use two times a day prn  #1large x 1   Entered and Authorized by:   Corwin Levins MD   Signed by:   Corwin Levins MD on 02/07/2010   Method used:   Print then Give to Patient   RxID:   [id number]    Orders Added: 1)  Est. Patient 18-39 years [[address]]

## 2010-04-06 ENCOUNTER — Encounter: Payer: Self-pay | Admitting: Internal Medicine

## 2010-04-20 NOTE — Letter (Signed)
Summary: Medical Examiner's Certificate  Medical Examiner's Certificate   Imported By: Sherian Rein 04/10/2010 08:10:14  _____________________________________________________________________  External Attachment:    Type:   Image     Comment:   External Document

## 2010-04-25 NOTE — Letter (Signed)
Summary: Exam forms for Lawyer  Exam forms for [employer] Determination   Imported By: Sherian Rein 04/19/2010 12:31:20  _____________________________________________________________________  External Attachment:    Type:   Image     Comment:   External Document

## 2010-05-12 ENCOUNTER — Other Ambulatory Visit: Payer: Self-pay | Admitting: Internal Medicine

## 2010-06-16 ENCOUNTER — Inpatient Hospital Stay (INDEPENDENT_AMBULATORY_CARE_PROVIDER_SITE_OTHER)
Admission: RE | Admit: 2010-06-16 | Discharge: 2010-06-16 | Disposition: A | Discharge status: Home / Self Care | Payer: Managed Care, Other (non HMO) | Source: Ambulatory Visit | Attending: Emergency Medicine | Admitting: Emergency Medicine

## 2010-06-16 ENCOUNTER — Ambulatory Visit (INDEPENDENT_AMBULATORY_CARE_PROVIDER_SITE_OTHER): Payer: Managed Care, Other (non HMO)

## 2010-06-16 DIAGNOSIS — N200 Calculus of kidney: Secondary | ICD-10-CM

## 2010-06-16 LAB — POCT URINALYSIS DIP (DEVICE)
Protein, ur: NEGATIVE mg/dL
Urobilinogen, UA: 0.2 mg/dL (ref 0.0–1.0)

## 2010-06-19 ENCOUNTER — Telehealth: Payer: Self-pay

## 2010-06-19 NOTE — Telephone Encounter (Signed)
Noted, no further action needed  OK to close note

## 2010-06-19 NOTE — Telephone Encounter (Signed)
Patients wife called to inform patient passed a kidney stone this morning. The patient was instructed by ER MD to bring the stone if passed to PCP as may want to send off for analysis. The patient is on the way to our office to drop off the stone, advise please.

## 2010-06-19 NOTE — Telephone Encounter (Signed)
Informed the patient of the need to be referred to a urologist per Dr. Jonny Ruiz. . The patient stated the urgent care had already referred him to a urologist.

## 2010-06-30 NOTE — Op Note (Signed)
Jonathan Watkins, Jonathan Watkins               ACCOUNT NO.:  [id number]   MEDICAL RECORD NO.:  [id number]          PATIENT TYPE:  AMB   LOCATION:  DSC                          FACILITY:  [facility]   PHYSICIAN:  Karol T. Lazarus Salines, M.D. DATE OF BIRTH:  [birth year]   DATE OF PROCEDURE:  01/01/2006  DATE OF DISCHARGE:                               OPERATIVE REPORT   PREOPERATIVE DIAGNOSIS:  Closed displaced nasal fracture.   POSTOPERATIVE DIAGNOSIS:  Closed displaced nasal fracture.   PROCEDURE PERFORMED:  Closed reduction nasal fracture with  stabilization.   SURGEON:  Gloris Manchester. Lazarus Salines, M.D.   ANESTHESIA:  MAC.   BLOOD LOSS:  None.   COMPLICATIONS:  None.   FINDINGS:  A heavy wide nasal dorsum with the bony portion displaced  towards the right and readily mobilized back in the midline.  Slight  septal corrugation internally.  No active bleeding.   PROCEDURE:  With the patient in the comfortable supine position, having  received preoperative Afrin nasal spray, intravenous anesthesia was  administered.  At an appropriate level, the patient was placed in a  slight sitting position.  The blunt fracture elevator was measured up to  the level of the medial canthus to for to avoid damaging the cribriform  plate region.  This was placed under the vault of the nose on the right  side and with anterior and left lateral pressure, the nose was reduced.  Refinement through the left nostril was performed.  There was no  significant bleeding.  The nose at this point was mobile but well-  centered.   The external nose was cleaned with alcohol and painted with skin prep,  following which Steri-Strips and then a small/medium [place] splint were  applied in the standard fashion.  The patient tolerated the procedure  nicely.  A small amount of blood was evacuated from the nose.  The  pharynx was cleaned.  The patient was returned to Anesthesia, awakened,  and transferred to recovery in stable condition.   COMMENT:  35 year old [place] immigrant male sustained a blunt trauma to  the nose approximately 10 days ago with obvious displacement, hence  indication for today's procedure.  Anticipate routine postoperative  recovery with attention to analgesia, ice, elevation.  Will remove the  splint in 10 days.  Given low anticipated risk of postanesthetic or  postsurgical complications, feel outpatient venue is appropriate.      Gloris Manchester. Lazarus Salines, M.D.  Electronically Signed     KTW/MEDQ  D:  01/01/2006  T:  01/01/2006  Job:  [id number]   cc:   Jonita Albee, M.D.

## 2010-06-30 NOTE — Op Note (Signed)
NAMEMAYS, PAINO                           ACCOUNT NO.:  [id number]   MEDICAL RECORD NO.:  [id number]                   PATIENT TYPE:  AMB   LOCATION:  DSC                                  FACILITY:  [facility]   PHYSICIAN:  Anselm Pancoast. Zachery Dakins, M.D.          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   ANESTHESIA:  General.   OPERATION:  1. Attempt to identify a fistula in ano.  We excised kind of a chronic     abscess cavity.  2. Proctoscopy.   SURGEON:  Anselm Pancoast. Zachery Dakins, M.D.   INDICATIONS FOR PROCEDURE:  The patient is a 35 year old male who has had  previous problems with perirectal infections and fistulas starting in 2000,  when he lived in [place].  He had two anal procedures there with a C-tong,  supposedly a high fistula opening, and then was taken back to surgery and a  little sphincterotomy performed.  He appeared to heal up.  He has had a  chronic problem with constipation, and then approximately a little over a  year ago, when he was living in [facility], he had a similar  problem, but this was on the left side and the surgeon describes, (and I do  have that operation note), a superficial fistula in ano that was treated  with a simple sphincterotomy.  I saw the patient in the office approximately  three weeks ago.  He was having a lot of sphincter spasm with tenderness  posteriorly, and you could see a little area that had a little chronic  opening, and I recommended that we try to get the operation note, since this  is most likely a recurrent problem, because I fear that if he has had a  sphincterotomy with a C-tong etc, that if we just do a kind of  sphincterotomy he may not have good anal control.  He has had a colonoscopy  back in  2000, and they had not noted any evidence of Crohn's disease or  chronic fistula in ano or chronic inflammatory bowel disease.  He comes in  now.  It is about three weeks later, and he states that he has been better  the last couple of days or the last week, but still having a little bit of  intermittent drainage.   DESCRIPTION OF PROCEDURE:  I placed up in the lithotomy position, after an  induction with general anesthesia with an LOA tube, and there is really no  opening posterior that is draining or a swollen area.  You can see a little  dimple to the right side that is nearly a kind of a 9 o'clock external  opening where there is a drop or two  of pus, and you can put a little probe  there, and it goes in and then kind of tends to go to the posterior, but I  could not find an internal fistula opening.  I then used a Shepherd's hook  and looked and tried to probe the grooves, etc and could not find anything  on internal anoscopic examination.  When I did the proctoscopic, I do not  see any really abnormality at all above the dentate line area.  I then used  a blunt needle with saline and tried to inject a little cavity.  Again I  could not see any saline going into the anal area at any of the internal  crypt areas.  Next, I injected methylene blue and again could not see an  internal fistula opening.  At this point, I basically cored out the external  opening in this kind of chronic fistulous tract that goes about 1.5 inches,  but then it kind of terminates, and I could not follow anything on further.  I did not actually do any division of any internal sphincters or anything at  this time.  The area that we were operating on is basically the old scarred  area where he has had the previous C-tong and fistula surgery in [place] in  2000.  I packed the area with Betadine gauze after a little cautery  hemostasis.  With this we have kind of worked under an internal hemorrhoid  that we now  got bleeding.  I put some #3-0 chromic sutures in it, but did  not actually remove the internal hemorrhoid.  I talked with the patient's wife that I fear that he is going to have  further problems, and in the future if he would have an area that is  actively draining, more similar to when I originally saw him, that is the  time that he ought to be examined under anesthesia, and not electively  unless it is an obvious area.  When I tried to probe him in the office, I  could not go any place, and it was just so uncomfortable that we were not  able to identify an internal fistula then either.                                               Anselm Pancoast. Zachery Dakins, M.D.    WJW/MEDQ  D:  04/13/2003  T:  04/13/2003  Job:  [id number]

## 2010-07-20 ENCOUNTER — Other Ambulatory Visit: Payer: Self-pay

## 2010-07-20 MED ORDER — CLOTRIMAZOLE-BETAMETHASONE 1-0.05 % EX CREA: TOPICAL_CREAM | CUTANEOUS | Status: DC

## 2011-02-21 ENCOUNTER — Telehealth: Payer: Self-pay

## 2011-02-21 DIAGNOSIS — Z0000 Encounter for general adult medical examination without abnormal findings: Secondary | ICD-10-CM

## 2011-02-21 NOTE — Telephone Encounter (Signed)
Put order in for physical labs. 

## 2011-03-15 ENCOUNTER — Other Ambulatory Visit (INDEPENDENT_AMBULATORY_CARE_PROVIDER_SITE_OTHER): Payer: Managed Care, Other (non HMO)

## 2011-03-15 DIAGNOSIS — Z0000 Encounter for general adult medical examination without abnormal findings: Secondary | ICD-10-CM

## 2011-03-15 LAB — HEPATIC FUNCTION PANEL
AST: 16 U/L (ref 0–37)
Alkaline Phosphatase: 47 U/L (ref 39–117)
Bilirubin, Direct: 0 mg/dL (ref 0.0–0.3)
Total Bilirubin: 0.7 mg/dL (ref 0.3–1.2)

## 2011-03-15 LAB — URINALYSIS, ROUTINE W REFLEX MICROSCOPIC
Bilirubin Urine: NEGATIVE
Ketones, ur: NEGATIVE
Total Protein, Urine: NEGATIVE
Urine Glucose: NEGATIVE
Urobilinogen, UA: 0.2 (ref 0.0–1.0)

## 2011-03-15 LAB — CBC WITH DIFFERENTIAL/PLATELET
Basophils Absolute: 0 10*3/uL (ref 0.0–0.1)
Eosinophils Absolute: 0.4 10*3/uL (ref 0.0–0.7)
Lymphocytes Relative: 32.1 % (ref 12.0–46.0)
MCHC: 34.1 g/dL (ref 30.0–36.0)
MCV: 88.3 fl (ref 78.0–100.0)
Monocytes Absolute: 0.3 10*3/uL (ref 0.1–1.0)
Neutrophils Relative %: 55 % (ref 43.0–77.0)
Platelets: 186 10*3/uL (ref 150.0–400.0)
RDW: 14.2 % (ref 11.5–14.6)

## 2011-03-15 LAB — LIPID PANEL
Cholesterol: 242 mg/dL — ABNORMAL HIGH (ref 0–200)
HDL: 41.4 mg/dL (ref >39.00)
Total CHOL/HDL Ratio: 6
Triglycerides: 309 mg/dL — ABNORMAL HIGH (ref 0.0–149.0)
VLDL: 61.8 mg/dL — ABNORMAL HIGH (ref 0.0–40.0)

## 2011-03-15 LAB — BASIC METABOLIC PANEL
BUN: 11 mg/dL (ref 6–23)
GFR: 119.65 mL/min (ref >60.00)
Potassium: 4.3 mEq/L (ref 3.5–5.1)
Sodium: 139 mEq/L (ref 135–145)

## 2011-03-17 ENCOUNTER — Encounter: Payer: Self-pay | Admitting: Internal Medicine

## 2011-03-17 DIAGNOSIS — Z0001 Encounter for general adult medical examination with abnormal findings: Secondary | ICD-10-CM | POA: Insufficient documentation

## 2011-03-17 DIAGNOSIS — Z0000 Encounter for general adult medical examination without abnormal findings: Secondary | ICD-10-CM | POA: Insufficient documentation

## 2011-03-21 ENCOUNTER — Ambulatory Visit (INDEPENDENT_AMBULATORY_CARE_PROVIDER_SITE_OTHER): Payer: Managed Care, Other (non HMO) | Admitting: Internal Medicine

## 2011-03-21 ENCOUNTER — Ambulatory Visit (INDEPENDENT_AMBULATORY_CARE_PROVIDER_SITE_OTHER)
Admission: RE | Admit: 2011-03-21 | Discharge: 2011-03-21 | Disposition: A | Discharge status: Home / Self Care | Payer: Managed Care, Other (non HMO) | Source: Ambulatory Visit | Attending: Internal Medicine | Admitting: Internal Medicine

## 2011-03-21 ENCOUNTER — Telehealth: Payer: Self-pay | Admitting: Internal Medicine

## 2011-03-21 ENCOUNTER — Encounter: Payer: Self-pay | Admitting: Internal Medicine

## 2011-03-21 VITALS — BP 110/70 | HR 86 | Temp 98.2°F | Ht 70.0 in | Wt 202.2 lb

## 2011-03-21 DIAGNOSIS — E119 Type 2 diabetes mellitus without complications: Secondary | ICD-10-CM | POA: Insufficient documentation

## 2011-03-21 DIAGNOSIS — R079 Chest pain, unspecified: Secondary | ICD-10-CM

## 2011-03-21 DIAGNOSIS — R7302 Impaired glucose tolerance (oral): Secondary | ICD-10-CM

## 2011-03-21 DIAGNOSIS — R7309 Other abnormal glucose: Secondary | ICD-10-CM

## 2011-03-21 DIAGNOSIS — Z0000 Encounter for general adult medical examination without abnormal findings: Secondary | ICD-10-CM

## 2011-03-21 DIAGNOSIS — N200 Calculus of kidney: Secondary | ICD-10-CM

## 2011-03-21 DIAGNOSIS — E785 Hyperlipidemia, unspecified: Secondary | ICD-10-CM

## 2011-03-21 HISTORY — DX: Impaired glucose tolerance (oral): R73.02

## 2011-03-21 HISTORY — DX: Calculus of kidney: N20.0

## 2011-03-21 NOTE — Telephone Encounter (Signed)
Done per emr 

## 2011-03-21 NOTE — Progress Notes (Signed)
Subjective:    Patient ID: Jonathan Watkins, male    DOB: 02-09-1976, 36 y.o.   MRN: [id number]  HPI  Here for wellness and f/u;  Overall doing ok;  Pt denies  worsening SOB, DOE, wheezing, orthopnea, PND, worsening LE edema, palpitations, dizziness or syncope.  Pt denies neurological change such as new Headache, facial or extremity weakness.  Pt denies polydipsia, polyuria, or low sugar symptoms. Pt states overall good compliance with treatment and medications, good tolerability, and trying to follow lower cholesterol diet.  Pt denies worsening depressive symptoms, suicidal ideation or panic. No fever, wt loss, night sweats, loss of appetite, or other constitutional symptoms.  Pt states good ability with ADL's, low fall risk, home safety reviewed and adequate, no significant changes in hearing or vision, and occasionally active with exercise.  Did have kidney stone x 2 in the past yr, seen per [facility]. Has had peak wt of 216 about 3 yrs ago with one slight elev BS, since improved with wt loss and maint at current wt 202.  Does mention a sharp left CP, pleuritic without ST, cough, fever, recent wt loss, night sweats, or sob/wheezing, intermittent for several days - ? Pulled muscle Past Medical History  Diagnosis Date  . Abdominal pain, left lower quadrant 10/29/2008  . ALLERGIC RHINITIS 10/29/2008  . ELEVATED BLOOD PRESSURE WITHOUT DIAGNOSIS OF HYPERTENSION 10/22/2007  . ERECTILE DYSFUNCTION 01/26/2008  . GERD 10/22/2007  . Headache 05/02/2009  . HEMATOCHEZIA 11/30/2008  . HYPERLIPIDEMIA 03/21/2007  . NECK PAIN 12/15/2009  . Other dysphagia 05/02/2009  . Personal history of colonic polyps 12/28/2008  . SEIZURE DISORDER 03/21/2007  . Renal stones 03/21/2011  . Impaired glucose tolerance 03/21/2011   Past Surgical History  Procedure Date  . Nasal surgury   . Anal fissure repair     reports that he has never smoked. He does not have any smokeless tobacco history on file. He reports that he does not drink  alcohol or use illicit drugs. family history is not on file. No Known Allergies Current Outpatient Prescriptions on File Prior to Visit  Medication Sig Dispense Refill  . clotrimazole-betamethasone (LOTRISONE) cream Apply to affected area 2 times daily  15 g  5   Review of Systems Review of Systems  Constitutional: Negative for diaphoresis, activity change, appetite change and unexpected weight change.  HENT: Negative for hearing loss, ear pain, facial swelling, mouth sores and neck stiffness.   Eyes: Negative for pain, redness and visual disturbance.  Respiratory: Negative for shortness of breath and wheezing.   Cardiovascular: Negative for chest pain and palpitations.  Gastrointestinal: Negative for diarrhea, blood in stool, abdominal distention and rectal pain.  Genitourinary: Negative for hematuria, flank pain and decreased urine volume.  Musculoskeletal: Negative for myalgias and joint swelling.  Skin: Negative for color change and wound.  Neurological: Negative for syncope and numbness.  Hematological: Negative for adenopathy.  Psychiatric/Behavioral: Negative for hallucinations, self-injury, decreased concentration and agitation.      Objective:   Physical Exam BP 110/70  Pulse 86  Temp(Src) 98.2 F (36.8 C) (Oral)  Ht 5\' 10"  (1.778 m)  Wt 202 lb 4 oz (91.74 kg)  BMI 29.02 kg/m2  SpO2 97% Physical Exam  VS noted Constitutional: Pt is oriented to person, place, and time. Appears well-developed and well-nourished.  HENT:  Head: Normocephalic and atraumatic.  Right Ear: External ear normal.  Left Ear: External ear normal.  Nose: Nose normal.  Mouth/Throat: Oropharynx is clear and moist.  Eyes:  Conjunctivae and EOM are normal. Pupils are equal, round, and reactive to light.  Neck: Normal range of motion. Neck supple. No JVD present. No tracheal deviation present.  Cardiovascular: Normal rate, regular rhythm, normal heart sounds and intact distal pulses.     Pulmonary/Chest: Effort normal and breath sounds normal.  Abdominal: Soft. Bowel sounds are normal. There is no tenderness.  Musculoskeletal: Normal range of motion. Exhibits no edema.  Lymphadenopathy:  Has no cervical adenopathy.  Neurological: Pt is alert and oriented to person, place, and time. Pt has normal reflexes. No cranial nerve deficit.  Skin: Skin is warm and dry. No rash noted.  Psychiatric:  Has  normal mood and affect. Behavior is normal.     Assessment & Plan:

## 2011-03-21 NOTE — Telephone Encounter (Signed)
Patient informed. 

## 2011-03-21 NOTE — Patient Instructions (Signed)
Your EKG was OK today Please go to XRAY in the Basement for the x-ray test Please call the phone number [phone] (the [employer]Tree System) for results of testing in 2-3 days;  When calling, simply dial the number, and when prompted enter the MRN number above (the Medical Record Number) and the # key, then the message should start. You are given the lab work copy today;  Please follow lower fat, lower cholesterol diet You are otherwise up to date wit prevention at this time Please return in 1 year for your yearly visit, or sooner if needed, with Lab testing done 3-5 days before

## 2011-03-21 NOTE — Assessment & Plan Note (Addendum)
ECG reviewed as per emr, atypical, for cxr,  to f/u any worsening symptoms or concerns

## 2011-03-21 NOTE — Telephone Encounter (Signed)
Message copied by Corwin Levins on Wed Mar 21, 2011  1:08 PM ------      Message from: Scharlene Gloss B      Created: Wed Mar 21, 2011  9:50 AM       Patient would like to go to the lab to have his vitamin D checked.

## 2011-03-25 ENCOUNTER — Encounter: Payer: Self-pay | Admitting: Internal Medicine

## 2011-03-25 NOTE — Assessment & Plan Note (Signed)
stable overall by hx and exam, most recent data reviewed with pt, and pt to continue medical treatment as before Lab Results  Component Value Date   HGBA1C 5.8 10/22/2007

## 2011-03-25 NOTE — Assessment & Plan Note (Addendum)
stable overall by hx and exam, most recent data reviewed with pt, and pt to continue medical treatment as before, for lower chol diet  Lab Results  Component Value Date   LDLCALC 113* 02/03/2010

## 2011-03-25 NOTE — Assessment & Plan Note (Signed)

## 2011-06-19 ENCOUNTER — Telehealth: Payer: Self-pay

## 2011-06-19 NOTE — Telephone Encounter (Signed)
The patients wife called concerning the patients lab results.  She stated his cholesterol was elevated and does he need medication? Also has been over one year since vitamin D was checked, please advise.

## 2011-06-19 NOTE — Telephone Encounter (Signed)
Called informed the patients wife of MD's response to her questions.

## 2011-06-19 NOTE — Telephone Encounter (Signed)
Last vit D was mar 2011 - normal after one prior to that being mild low  Ok to take vit d 1000 units per day as his was very easy to correct last visit  We do have a vit d level in the computer for next visit, as this is not usually included with physical labs   Also, last LDL was 139, which is slightly better than the average of 150;  There are no studies to prove that taking medicaition like statin before 36yo helps reduce the risk of stroke or heart attack, and normally is not prescribed aggressively unless there is strong FH of "early" heart disease (like a family with MI in their 22's), or unless pt requests it

## 2012-05-13 ENCOUNTER — Other Ambulatory Visit: Payer: Self-pay | Admitting: Internal Medicine

## 2012-11-27 ENCOUNTER — Other Ambulatory Visit (INDEPENDENT_AMBULATORY_CARE_PROVIDER_SITE_OTHER): Payer: Managed Care, Other (non HMO)

## 2012-11-27 ENCOUNTER — Telehealth: Payer: Self-pay | Admitting: Internal Medicine

## 2012-11-27 ENCOUNTER — Ambulatory Visit (INDEPENDENT_AMBULATORY_CARE_PROVIDER_SITE_OTHER): Payer: Managed Care, Other (non HMO) | Admitting: Internal Medicine

## 2012-11-27 ENCOUNTER — Encounter: Payer: Self-pay | Admitting: Internal Medicine

## 2012-11-27 VITALS — BP 112/70 | HR 73 | Temp 98.5°F | Ht 69.0 in | Wt 212.5 lb

## 2012-11-27 DIAGNOSIS — R7302 Impaired glucose tolerance (oral): Secondary | ICD-10-CM

## 2012-11-27 DIAGNOSIS — M79609 Pain in unspecified limb: Secondary | ICD-10-CM

## 2012-11-27 DIAGNOSIS — M79605 Pain in left leg: Secondary | ICD-10-CM | POA: Insufficient documentation

## 2012-11-27 DIAGNOSIS — Z23 Encounter for immunization: Secondary | ICD-10-CM

## 2012-11-27 DIAGNOSIS — Z0000 Encounter for general adult medical examination without abnormal findings: Secondary | ICD-10-CM

## 2012-11-27 DIAGNOSIS — E785 Hyperlipidemia, unspecified: Secondary | ICD-10-CM

## 2012-11-27 DIAGNOSIS — R7309 Other abnormal glucose: Secondary | ICD-10-CM

## 2012-11-27 LAB — BASIC METABOLIC PANEL
Calcium: 9.4 mg/dL (ref 8.4–10.5)
Chloride: 101 mEq/L (ref 96–112)
Creatinine, Ser: 0.9 mg/dL (ref 0.4–1.5)
Glucose, Bld: 119 mg/dL — ABNORMAL HIGH (ref 70–99)
Potassium: 4.3 mEq/L (ref 3.5–5.1)
Sodium: 139 mEq/L (ref 135–145)

## 2012-11-27 LAB — CBC WITH DIFFERENTIAL/PLATELET
Basophils Absolute: 0 10*3/uL (ref 0.0–0.1)
Basophils Relative: 0.1 % (ref 0.0–3.0)
Eosinophils Absolute: 0.3 10*3/uL (ref 0.0–0.7)
Hemoglobin: 15.4 g/dL (ref 13.0–17.0)
Lymphocytes Relative: 32.9 % (ref 12.0–46.0)
MCHC: 33.8 g/dL (ref 30.0–36.0)
MCV: 86.4 fl (ref 78.0–100.0)
Monocytes Absolute: 0.4 10*3/uL (ref 0.1–1.0)
Monocytes Relative: 7.6 % (ref 3.0–12.0)
Neutro Abs: 2.6 10*3/uL (ref 1.4–7.7)
Platelets: 201 10*3/uL (ref 150.0–400.0)
RBC: 5.28 Mil/uL (ref 4.22–5.81)
RDW: 13.7 % (ref 11.5–14.6)

## 2012-11-27 LAB — LIPID PANEL
Cholesterol: 230 mg/dL — ABNORMAL HIGH (ref 0–200)
Total CHOL/HDL Ratio: 6
VLDL: 33.2 mg/dL (ref 0.0–40.0)

## 2012-11-27 LAB — URINALYSIS, ROUTINE W REFLEX MICROSCOPIC
Bilirubin Urine: NEGATIVE
Hgb urine dipstick: NEGATIVE
Ketones, ur: NEGATIVE
Total Protein, Urine: NEGATIVE
Urine Glucose: NEGATIVE
WBC, UA: NONE SEEN (ref 0–?)

## 2012-11-27 LAB — HEMOGLOBIN A1C: Hgb A1c MFr Bld: 6.7 % — ABNORMAL HIGH (ref 4.6–6.5)

## 2012-11-27 LAB — HEPATIC FUNCTION PANEL
ALT: 22 U/L (ref 0–53)
AST: 22 U/L (ref 0–37)
Albumin: 4.4 g/dL (ref 3.5–5.2)
Alkaline Phosphatase: 43 U/L (ref 39–117)
Bilirubin, Direct: 0.1 mg/dL (ref 0.0–0.3)
Total Bilirubin: 0.9 mg/dL (ref 0.3–1.2)
Total Protein: 7.7 g/dL (ref 6.0–8.3)

## 2012-11-27 LAB — LDL CHOLESTEROL, DIRECT: Direct LDL: 158.6 mg/dL

## 2012-11-27 MED ORDER — NAPROXEN 500 MG PO TABS: 500.0000 mg | ORAL_TABLET | Freq: Two times a day (BID) | ORAL | Status: DC

## 2012-11-27 NOTE — Progress Notes (Signed)
Subjective:    Patient ID: Jonathan Watkins, male    DOB: Feb 03, 1976, 37 y.o.   MRN: [id number]  HPI  Here for wellness and f/u;  Overall doing ok;  Pt denies CP, worsening SOB, DOE, wheezing, orthopnea, PND, worsening LE edema, palpitations, dizziness or syncope.  Pt denies neurological change such as new headache, facial or extremity weakness.  Pt denies polydipsia, polyuria, or low sugar symptoms. Pt states overall good compliance with treatment and medications, good tolerability, and has been trying to follow lower cholesterol diet.  Pt denies worsening depressive symptoms, suicidal ideation or panic. No fever, night sweats, wt loss, loss of appetite, or other constitutional symptoms.  Pt states good ability with ADL's, has low fall risk, home safety reviewed and adequate, no other significant changes in hearing or vision, and only occasionally active with exercise. 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 Gained approx 20 lbs in 18 mo. Going to gym regularly to start, but then not able after some time.  Has had some recurring pain to left post thigh, seems to start near the buttock and radiates along the post thigh to near the knee but no farther, and no other LE pain, weakness or numbness. Sits daily for 5 yrs minimum on hard chair at the office for the last 18 mo. Past Medical History  Diagnosis Date  . Abdominal pain, left lower quadrant 10/29/2008  . ALLERGIC RHINITIS 10/29/2008  . ELEVATED BLOOD PRESSURE WITHOUT DIAGNOSIS OF HYPERTENSION 10/22/2007  . ERECTILE DYSFUNCTION 01/26/2008  . GERD 10/22/2007  . Headache(784.0) 05/02/2009  . HEMATOCHEZIA 11/30/2008  . HYPERLIPIDEMIA 03/21/2007  . NECK PAIN 12/15/2009  . Other dysphagia 05/02/2009  . Personal history of colonic polyps 12/28/2008  . SEIZURE DISORDER 03/21/2007  . Renal stones 03/21/2011  . Impaired glucose tolerance 03/21/2011   Past Surgical History   Procedure Laterality Date  . Nasal surgury    . Anal fissure repair      reports that he has never smoked. He does not have any smokeless tobacco history on file. He reports that he does not drink alcohol or use illicit drugs. family history is not on file. No Known Allergies  Review of Systems Constitutional: Negative for diaphoresis, activity change, appetite change or unexpected weight change.  HENT: Negative for hearing loss, ear pain, facial swelling, mouth sores and neck stiffness.   Eyes: Negative for pain, redness and visual disturbance.  Respiratory: Negative for shortness of breath and wheezing.   Cardiovascular: Negative for chest pain and palpitations.  Gastrointestinal: Negative for diarrhea, blood in stool, abdominal distention or other pain Genitourinary: Negative for hematuria, flank pain or change in urine volume.  Musculoskeletal: Negative for myalgias and joint swelling.  Skin: Negative for color change and wound.  Neurological: Negative for syncope and numbness. other than noted Hematological: Negative for adenopathy.  Psychiatric/Behavioral: Negative for hallucinations, self-injury, decreased concentration and agitation.      Objective:   Physical Exam BP 112/70  Pulse 73  Temp(Src) 98.5 F (36.9 C) (Oral)  Ht 5\' 9"  (1.753 m)  Wt 212 lb 8 oz (96.389 kg)  BMI 31.37 kg/m2  SpO2 96% VS noted,  Constitutional: Pt is oriented to person, place, and time. Appears well-developed and well-nourished.  Head: Normocephalic and atraumatic.  Right Ear: External ear normal.  Left Ear: External ear normal.  Nose: Nose normal.  Mouth/Throat: Oropharynx is clear  and moist.  Eyes: Conjunctivae and EOM are normal. Pupils are equal, round, and reactive to light.  Neck: Normal range of motion. Neck supple. No JVD present. No tracheal deviation present.  Cardiovascular: Normal rate, regular rhythm, normal heart sounds and intact distal pulses.   Pulmonary/Chest: Effort  normal and breath sounds normal.  Abdominal: Soft. Bowel sounds are normal. There is no tenderness. No HSM  Musculoskeletal: Normal range of motion. Exhibits no edema.  Lymphadenopathy:  Has no cervical adenopathy.  Neurological: Pt is alert and oriented to person, place, and time. Pt has normal reflexes. No cranial nerve deficit. Motor 5/5, sens, dtr intact to LE's, some tenderness noted post thigh near hamstring insertion site at buttcks, also mild discomfort with full flexion of hip while lying  Skin: Skin is warm and dry. No rash noted.  Psychiatric:  Has  normal mood and affect. Behavior is normal.     Assessment & Plan:

## 2012-11-27 NOTE — Assessment & Plan Note (Signed)

## 2012-11-27 NOTE — Assessment & Plan Note (Signed)
prob hamstring strain vs pyriformis irritation, mild, for nsaid prn, consider refer to Dr Smith/sports med if worse or persists

## 2012-11-27 NOTE — Telephone Encounter (Signed)
Jonathan Watkins - needs fu at 6 mo with labs prior

## 2012-11-27 NOTE — Addendum Note (Signed)
Addended by: Scharlene Gloss B on: 11/27/2012 01:38 PM   Modules accepted: Orders

## 2012-11-27 NOTE — Patient Instructions (Signed)
You had the flu shot today Please take all new medication as prescribed - the anti-inflammatory Please continue all other medications as before Please have the pharmacy call with any other refills you may need. Please continue your efforts at being more active, low cholesterol diet, and weight control. You are otherwise up to date with prevention measures today. Please keep your appointments with your specialists as you may have planned  Please go to the LAB in the Basement (turn left off the elevator) for the tests to be done today You will be contacted by phone if any changes need to be made immediately.  Otherwise, you will receive a letter about your results with an explanation, but please check with MyChart first.  Please remember to sign up for My Chart if you have not done so, as this will be important to you in the future with finding out test results, communicating by private email, and scheduling acute appointments online when needed.  Please return in 1 year for your yearly visit, or sooner if needed

## 2012-11-27 NOTE — Assessment & Plan Note (Signed)
stable overall by history and exam, recent data reviewed with pt, and pt to continue medical treatment as before,  to f/u any worsening symptoms or concerns Lab Results  Component Value Date   HGBA1C 5.8 10/22/2007

## 2013-02-09 ENCOUNTER — Encounter: Payer: Managed Care, Other (non HMO) | Attending: Internal Medicine | Admitting: *Deleted

## 2013-02-09 ENCOUNTER — Encounter: Payer: Self-pay | Admitting: *Deleted

## 2013-02-09 VITALS — Ht 69.0 in | Wt 217.4 lb

## 2013-02-09 DIAGNOSIS — Z713 Dietary counseling and surveillance: Secondary | ICD-10-CM | POA: Insufficient documentation

## 2013-02-09 DIAGNOSIS — R7302 Impaired glucose tolerance (oral): Secondary | ICD-10-CM

## 2013-02-09 DIAGNOSIS — R7309 Other abnormal glucose: Secondary | ICD-10-CM | POA: Insufficient documentation

## 2013-02-09 NOTE — Patient Instructions (Signed)
Plan:  Aim for 3 Carb Choices per meal (45 grams) +/- 1 either way  Aim for 0-2 Carbs per snack if hungry  Use protein for appetite control Consider reading food labels for Total Carbohydrate of foods Consider  increasing your activity level daily as tolerated

## 2013-02-09 NOTE — Progress Notes (Signed)
Appt start time: 1130 end time:  1300.  Assessment:  Patient was seen on  02/09/13 for individual diabetes education. Patient is newly diagnosed. He lives here but works in [place] right now, so travels every 3-4 weeks. Were more physically active about 1 year ago going to gym regularly until job location changed. Eats out in restaurants more often due to being away from family.  Current HbA1c: 6.7% on 11/27/12  Preferred Learning Style:   No preference indicated   Learning Readiness:   Ready  Change in progress  MEDICATIONS: none at this time  DIETARY INTAKE:  24-hr recall:  B ( AM): skips usually  Snk ( AM): coffee with fat free flavored cream and 2 tsp honey  L ( PM): salad with protein OR full hot meal with lean meat, starch usually rice, not usually vegetables, water  Snk ( PM): no D ( PM): 15-20 almonds when he gets home from work and before dinner which is similar to lunch Snk ( PM): fresh fruit Beverages: water   Usual physical activity: not at this time. Plans to resume once he moves back to [facility]  Estimated energy needs: 1600 calories 180 g carbohydrates 120 g protein 44 g fat  Intervention:  Nutrition counseling provided.  Discussed diabetes disease process and treatment options.  Discussed physiology of diabetes and role of obesity on insulin resistance.  Encouraged moderate weight reduction to improve glucose levels.  Discussed role of medications and diet in glucose control  Provided education on macronutrients on glucose levels.  Provided education on carb counting, importance of regularly scheduled meals/snacks, and meal planning  Discussed effects of physical activity on glucose levels and long-term glucose control.   Discussed blood glucose monitoring and interpretation.  Discussed recommended target ranges and individual ranges.    Described short-term complications: hyper- and hypo-glycemia.    Provided information on the following:  Prevention,  detection, and treatment of long-term complications.  Discussed the role of prolonged elevated glucose levels on body systems.  Role of stress on blood glucose levels and discussed strategies to manage psychosocial issues.  Recommendations for long-term diabetes self-care.  Established checklist for medical, dental, and emotional self-care.  Plan:  Aim for 3 Carb Choices per meal (45 grams) +/- 1 either way  Aim for 0-2 Carbs per snack if hungry  Use protein for appetite control Consider reading food labels for Total Carbohydrate of foods Consider  increasing your activity level daily as tolerated  Teaching Method Utilized: all of the following Visual Auditory Hands on  Handouts given during visit include: Living Well with Diabetes Carb Counting and Food Label handouts Meal Plan Card  Barriers to learning/adherence to lifestyle change: living away from family in [place]  Diabetes self-care support plan:   [facility] support group available  Demonstrated degree of understanding via:  Teach Back   Monitoring/Evaluation:  Dietary intake, exercise, reading food labels, and body weight prn.

## 2013-10-20 ENCOUNTER — Encounter: Payer: Self-pay | Admitting: Gastroenterology

## 2013-11-13 ENCOUNTER — Telehealth: Payer: Self-pay | Admitting: Internal Medicine

## 2013-11-13 DIAGNOSIS — Z0000 Encounter for general adult medical examination without abnormal findings: Secondary | ICD-10-CM

## 2013-11-13 NOTE — Telephone Encounter (Signed)
Labs have been ordered

## 2013-11-13 NOTE — Telephone Encounter (Signed)
Patient is having pain "in his gut under his stomach".  I have scheduled him for wed at 5:30pm.  He is requesting to have labs (A1C) entered in before appt.

## 2013-11-18 ENCOUNTER — Encounter: Payer: Self-pay | Admitting: Internal Medicine

## 2013-11-18 ENCOUNTER — Ambulatory Visit (INDEPENDENT_AMBULATORY_CARE_PROVIDER_SITE_OTHER): Payer: Managed Care, Other (non HMO) | Admitting: Internal Medicine

## 2013-11-18 VITALS — BP 118/72 | HR 86 | Temp 98.1°F | Ht 69.0 in | Wt 222.2 lb

## 2013-11-18 DIAGNOSIS — E119 Type 2 diabetes mellitus without complications: Secondary | ICD-10-CM

## 2013-11-18 DIAGNOSIS — Z0000 Encounter for general adult medical examination without abnormal findings: Secondary | ICD-10-CM

## 2013-11-18 DIAGNOSIS — R1084 Generalized abdominal pain: Secondary | ICD-10-CM

## 2013-11-18 DIAGNOSIS — Z0189 Encounter for other specified special examinations: Secondary | ICD-10-CM

## 2013-11-18 DIAGNOSIS — Z23 Encounter for immunization: Secondary | ICD-10-CM

## 2013-11-18 DIAGNOSIS — R109 Unspecified abdominal pain: Secondary | ICD-10-CM | POA: Insufficient documentation

## 2013-11-18 DIAGNOSIS — R7302 Impaired glucose tolerance (oral): Secondary | ICD-10-CM

## 2013-11-18 MED ORDER — PANTOPRAZOLE SODIUM 40 MG PO TBEC: 40.0000 mg | DELAYED_RELEASE_TABLET | Freq: Every day | ORAL | Status: DC

## 2013-11-18 NOTE — Assessment & Plan Note (Signed)
Etiology unclear, possibly recent ? Passed stone type of pain on the rigth, also with mid abd discomfort, afeb, exam benign, for now to start PPI, for h pylori and labs as ordered, to f/u for worsening symptoms, vomiting, wt loss, blood

## 2013-11-18 NOTE — Progress Notes (Signed)
   Subjective:    Patient ID: Jonathan Watkins, male    DOB: Jun 22, 1975, 38 y.o.   MRN: [id number]  HPIm Here with abd pain ? More than one type, started mid abd pain,crampy mod constant then off and on for 2-3 wks, then with also starting 10 days ago with right flank pain, sharp, no radiation, no n/v, no fever, no blood in urine, somewhat similar to hx of left sided renal stone seen in ER 2012, none since then.  No further right falnk pain for the past wk, but still have the mid abd pain. Has somewhat irreg BM's more than usual, with bloating, passing more gas, no blood. Stools no change otherwise.  No wt loss. No joint pain, or rash except for muscular pain with excericse, now very diligent with activity and diet since a1c was elevated last yr slightly.  Now with new job at [employer] locally instead of traveling like last yr. Has hx of recurrent reflux with pizza in past, takes zantac maybe twice per wk Past Medical History  Diagnosis Date  . Abdominal pain, left lower quadrant 10/29/2008  . ALLERGIC RHINITIS 10/29/2008  . ELEVATED BLOOD PRESSURE WITHOUT DIAGNOSIS OF HYPERTENSION 10/22/2007  . ERECTILE DYSFUNCTION 01/26/2008  . GERD 10/22/2007  . Headache(784.0) 05/02/2009  . HEMATOCHEZIA 11/30/2008  . HYPERLIPIDEMIA 03/21/2007  . NECK PAIN 12/15/2009  . Other dysphagia 05/02/2009  . Personal history of colonic polyps 12/28/2008  . SEIZURE DISORDER 03/21/2007  . Renal stones 03/21/2011  . Impaired glucose tolerance 03/21/2011   Past Surgical History  Procedure Laterality Date  . Nasal surgury    . Anal fissure repair      reports that he has never smoked. He has never used smokeless tobacco. He reports that he drinks alcohol. He reports that he does not use illicit drugs. family history is not on file. No Known Allergies No current outpatient prescriptions on file prior to visit.   No current facility-administered medications on file prior to visit.   Review of Systems  Constitutional: Negative for  unusual diaphoresis or other sweats  HENT: Negative for ringing in ear Eyes: Negative for double vision or worsening visual disturbance.  Respiratory: Negative for choking and stridor.   Gastrointestinal: Negative for vomiting or other signifcant bowel change Genitourinary: Negative for hematuria or decreased urine volume.  Musculoskeletal: Negative for other MSK pain or swelling Skin: Negative for color change and worsening wound.  Neurological: Negative for tremors and numbness other than noted  Psychiatric/Behavioral: Negative for decreased concentration or agitation other than above       Objective:   Physical Exam BP 118/72  Pulse 86  Temp(Src) 98.1 F (36.7 C) (Oral)  Ht 5\' 9"  (1.753 m)  Wt 222 lb 4 oz (100.812 kg)  BMI 32.81 kg/m2  SpO2 95% VS noted,  Constitutional: Pt appears well-developed, well-nourished.  HENT: Head: NCAT.  Right Ear: External ear normal.  Left Ear: External ear normal.  Eyes: . Pupils are equal, round, and reactive to light. Conjunctivae and EOM are normal Neck: Normal range of motion. Neck supple.  Cardiovascular: Normal rate and regular rhythm.   Pulmonary/Chest: Effort normal and breath sounds normal.  Abd:  Soft, NT, ND, + BS - benign exam Neurological: Pt is alert. Not confused , motor grossly intact Skin: Skin is warm. No rash Psychiatric: Pt behavior is normal. No agitation.     Assessment & Plan:

## 2013-11-18 NOTE — Assessment & Plan Note (Signed)
With elev a1c last yr, no med tx but has been quite diligent at diet and exercise, has lost fat wt, overall wt increased liekly due to more muscle shoulder mass?  For f/u a1c, cont diet/excercise

## 2013-11-18 NOTE — Patient Instructions (Signed)
Please take all new medication as prescribed - the protonix 40 mg per day  Please stop the naprosyn, as this could possible irritate the stomach  Please call or return for any worsening pain, fever, vomiting, further bowel change or blood  Please continue all other medications as before, and refills have been done if requested.  Please have the pharmacy call with any other refills you may need.  Please keep your appointments with your specialists as you may have planned  Please go to the LAB in the Basement (turn left off the elevator) for the tests to be done at your convenience  You will be contacted by phone if any changes need to be made immediately.  Otherwise, you will receive a letter about your results with an explanation, but please check with MyChart first.  Please return as you plan for the Physical, at your convenience

## 2013-11-18 NOTE — Progress Notes (Signed)
Pre visit review using our clinic review tool, if applicable. No additional management support is needed unless otherwise documented below in the visit note. 

## 2014-01-29 ENCOUNTER — Telehealth: Payer: Self-pay

## 2014-01-29 ENCOUNTER — Other Ambulatory Visit: Payer: Self-pay | Admitting: Internal Medicine

## 2014-01-29 ENCOUNTER — Telehealth: Payer: Self-pay | Admitting: *Deleted

## 2014-01-29 MED ORDER — TAMSULOSIN HCL 0.4 MG PO CAPS: 0.4000 mg | ORAL_CAPSULE | Freq: Every day | ORAL | Status: DC

## 2014-01-29 NOTE — Telephone Encounter (Signed)
Done erx 

## 2014-01-29 NOTE — Telephone Encounter (Signed)
A user error has taken place: encounter opened in error, closed for administrative reasons.

## 2014-01-29 NOTE — Telephone Encounter (Signed)
PT WANTS SOMETHING (FLOMAX?) TO HELP HIM PASS KIDNEY STONE. HE ALREADY HAS PASSED ONE STONE. HE SAYS HE STILL HAS ANOTHER ONE TO PASS. PT SAYS HE MENTIONED THIS TO DR. Jenny Reichmann WHEN HE WAS LAST SEEN IN OCTOBER.

## 2014-02-18 ENCOUNTER — Telehealth: Payer: Self-pay

## 2014-02-18 MED ORDER — MEFLOQUINE HCL 250 MG PO TABS: ORAL_TABLET | ORAL | Status: DC

## 2014-02-18 NOTE — Telephone Encounter (Signed)
rx done erx 

## 2014-02-18 NOTE — Telephone Encounter (Signed)
Patient is going to [place] for 6 weeks and needs medication for Malaria

## 2014-11-30 ENCOUNTER — Telehealth: Payer: Self-pay | Admitting: Internal Medicine

## 2014-11-30 DIAGNOSIS — E119 Type 2 diabetes mellitus without complications: Secondary | ICD-10-CM

## 2014-11-30 DIAGNOSIS — Z0000 Encounter for general adult medical examination without abnormal findings: Secondary | ICD-10-CM

## 2014-11-30 NOTE — Telephone Encounter (Signed)
Patient is scheduled for CPE on 11/3.  Is requesting normal labs to be put in the system before hand along with Vit D and A1C.

## 2014-12-02 NOTE — Telephone Encounter (Signed)
No history of Vitamin D def. Will have to see md prior to have that test done...Johny Chess

## 2014-12-14 NOTE — Telephone Encounter (Signed)
Pt's spouse advised that pt has no history of Vit D Def, therefore lab test will not be covered by insurance via personal VM

## 2014-12-14 NOTE — Telephone Encounter (Signed)
Wife states this should be in chart and has to be added.  States husband is going to lab tomorrow.

## 2014-12-15 ENCOUNTER — Other Ambulatory Visit (INDEPENDENT_AMBULATORY_CARE_PROVIDER_SITE_OTHER): Payer: 59

## 2014-12-15 DIAGNOSIS — Z0000 Encounter for general adult medical examination without abnormal findings: Secondary | ICD-10-CM

## 2014-12-15 DIAGNOSIS — Z0189 Encounter for other specified special examinations: Secondary | ICD-10-CM | POA: Diagnosis not present

## 2014-12-15 DIAGNOSIS — E119 Type 2 diabetes mellitus without complications: Secondary | ICD-10-CM

## 2014-12-15 LAB — TSH: TSH: 0.94 u[IU]/mL (ref 0.35–4.50)

## 2014-12-15 LAB — URINALYSIS, ROUTINE W REFLEX MICROSCOPIC
Bilirubin Urine: NEGATIVE
KETONES UR: NEGATIVE
LEUKOCYTES UA: NEGATIVE
NITRITE: NEGATIVE
Specific Gravity, Urine: 1.025 (ref 1.000–1.030)
TOTAL PROTEIN, URINE-UPE24: NEGATIVE
URINE GLUCOSE: NEGATIVE
Urobilinogen, UA: 0.2 (ref 0.0–1.0)
pH: 6 (ref 5.0–8.0)

## 2014-12-15 LAB — BASIC METABOLIC PANEL
BUN: 14 mg/dL (ref 6–23)
CALCIUM: 10.1 mg/dL (ref 8.4–10.5)
CO2: 29 meq/L (ref 19–32)
Chloride: 104 mEq/L (ref 96–112)
Creatinine, Ser: 0.94 mg/dL (ref 0.40–1.50)
GFR: 94.55 mL/min (ref >60.00)
Glucose, Bld: 151 mg/dL — ABNORMAL HIGH (ref 70–99)
Potassium: 4.6 mEq/L (ref 3.5–5.1)
SODIUM: 142 meq/L (ref 135–145)

## 2014-12-15 LAB — HEPATIC FUNCTION PANEL
ALBUMIN: 4.3 g/dL (ref 3.5–5.2)
ALT: 34 U/L (ref 0–53)
AST: 18 U/L (ref 0–37)
Alkaline Phosphatase: 52 U/L (ref 39–117)
BILIRUBIN DIRECT: 0.1 mg/dL (ref 0.0–0.3)
TOTAL PROTEIN: 7.6 g/dL (ref 6.0–8.3)
Total Bilirubin: 0.7 mg/dL (ref 0.2–1.2)

## 2014-12-15 LAB — CBC WITH DIFFERENTIAL/PLATELET
BASOS ABS: 0 10*3/uL (ref 0.0–0.1)
Basophils Relative: 0.7 % (ref 0.0–3.0)
Eosinophils Absolute: 0.3 10*3/uL (ref 0.0–0.7)
Eosinophils Relative: 5.1 % — ABNORMAL HIGH (ref 0.0–5.0)
HEMATOCRIT: 47.1 % (ref 39.0–52.0)
HEMOGLOBIN: 15.6 g/dL (ref 13.0–17.0)
LYMPHS PCT: 31.7 % (ref 12.0–46.0)
Lymphs Abs: 1.6 10*3/uL (ref 0.7–4.0)
MCHC: 33.1 g/dL (ref 30.0–36.0)
MCV: 87.3 fl (ref 78.0–100.0)
MONOS PCT: 6.6 % (ref 3.0–12.0)
Monocytes Absolute: 0.3 10*3/uL (ref 0.1–1.0)
NEUTROS ABS: 2.8 10*3/uL (ref 1.4–7.7)
Neutrophils Relative %: 55.9 % (ref 43.0–77.0)
Platelets: 217 10*3/uL (ref 150.0–400.0)
RBC: 5.4 Mil/uL (ref 4.22–5.81)
RDW: 14.1 % (ref 11.5–15.5)
WBC: 5 10*3/uL (ref 4.0–10.5)

## 2014-12-15 LAB — LIPID PANEL
Cholesterol: 214 mg/dL — ABNORMAL HIGH (ref 0–200)
HDL: 35.5 mg/dL — AB (ref >39.00)
NONHDL: 178.58
Total CHOL/HDL Ratio: 6
Triglycerides: 268 mg/dL — ABNORMAL HIGH (ref 0.0–149.0)
VLDL: 53.6 mg/dL — ABNORMAL HIGH (ref 0.0–40.0)

## 2014-12-15 LAB — HEMOGLOBIN A1C: Hgb A1c MFr Bld: 7.1 % — ABNORMAL HIGH (ref 4.6–6.5)

## 2014-12-15 LAB — LDL CHOLESTEROL, DIRECT: LDL DIRECT: 131 mg/dL

## 2014-12-16 ENCOUNTER — Ambulatory Visit (INDEPENDENT_AMBULATORY_CARE_PROVIDER_SITE_OTHER): Payer: 59 | Admitting: Internal Medicine

## 2014-12-16 ENCOUNTER — Encounter: Payer: Self-pay | Admitting: Internal Medicine

## 2014-12-16 VITALS — BP 128/88 | HR 81 | Temp 98.5°F | Ht 69.0 in | Wt 219.0 lb

## 2014-12-16 DIAGNOSIS — Z23 Encounter for immunization: Secondary | ICD-10-CM

## 2014-12-16 DIAGNOSIS — E119 Type 2 diabetes mellitus without complications: Secondary | ICD-10-CM

## 2014-12-16 DIAGNOSIS — E785 Hyperlipidemia, unspecified: Secondary | ICD-10-CM | POA: Diagnosis not present

## 2014-12-16 DIAGNOSIS — Z0000 Encounter for general adult medical examination without abnormal findings: Secondary | ICD-10-CM | POA: Diagnosis not present

## 2014-12-16 MED ORDER — METFORMIN HCL ER 500 MG PO TB24: 500.0000 mg | ORAL_TABLET | Freq: Every day | ORAL | Status: DC

## 2014-12-16 NOTE — Addendum Note (Signed)
Addended by: Lyman Bishop on: 12/16/2014 05:23 PM   Modules accepted: Orders

## 2014-12-16 NOTE — Assessment & Plan Note (Signed)
New onset, for better diet, wt loss, exercise, declines nutrition counseling for now, for metformin ER 500 qd, f.u labs next visit

## 2014-12-16 NOTE — Patient Instructions (Addendum)
You had the Prevnar pneumonia shot today  Please take all new medication as prescribed  - the metformin ER 500 mg per day (with bfast)  Please continue all other medications as before, and refills have been done if requested.  Please have the pharmacy call with any other refills you may need.  Please continue your efforts at being more active, low cholesterol diet, and weight control.  You are otherwise up to date with prevention measures today.  Please keep your appointments with your specialists as you may have planned  Please return in 6 months, or sooner if needed, with Lab testing done 3-5 days before

## 2014-12-16 NOTE — Progress Notes (Signed)
Pre visit review using our clinic review tool, if applicable. No additional management support is needed unless otherwise documented below in the visit note. 

## 2014-12-16 NOTE — Assessment & Plan Note (Signed)
decliens statin for now, for DM low chol diet, fu next visit

## 2014-12-16 NOTE — Progress Notes (Signed)
Subjective:    Patient ID: Jonathan Watkins, male    DOB: 12-Jul-1975, 39 y.o.   MRN: [id number]  HPI  Here for wellness and f/u;  Overall doing ok;  Pt denies Chest pain, worsening SOB, DOE, wheezing, orthopnea, PND, worsening LE edema, palpitations, dizziness or syncope.  Pt denies neurological change such as new headache, facial or extremity weakness.  Pt denies polydipsia, polyuria, or low sugar symptoms. Pt states overall good compliance with treatment and medications, good tolerability, and has been trying to follow appropriate diet.  Pt denies worsening depressive symptoms, suicidal ideation or panic. No fever, night sweats, wt loss, loss of appetite, or other constitutional symptoms.  Pt states good ability with ADL's, has low fall risk, home safety reviewed and adequate, no other significant changes in hearing or vision, and only occasionally active with exercise, but plans to start going back at 4 days per wk.  Past Medical History  Diagnosis Date  . Abdominal pain, left lower quadrant 10/29/2008  . ALLERGIC RHINITIS 10/29/2008  . ELEVATED BLOOD PRESSURE WITHOUT DIAGNOSIS OF HYPERTENSION 10/22/2007  . ERECTILE DYSFUNCTION 01/26/2008  . GERD 10/22/2007  . Headache(784.0) 05/02/2009  . HEMATOCHEZIA 11/30/2008  . HYPERLIPIDEMIA 03/21/2007  . NECK PAIN 12/15/2009  . Other dysphagia 05/02/2009  . Personal history of colonic polyps 12/28/2008  . SEIZURE DISORDER 03/21/2007  . Renal stones 03/21/2011  . Impaired glucose tolerance 03/21/2011   Past Surgical History  Procedure Laterality Date  . Nasal surgury    . Anal fissure repair      reports that he has never smoked. He has never used smokeless tobacco. He reports that he drinks alcohol. He reports that he does not use illicit drugs. family history is not on file. No Known Allergies Current Outpatient Prescriptions on File Prior to Visit  Medication Sig Dispense Refill  . mefloquine (LARIAM) 250 MG tablet 1 tab by mouth per wk starting one week  prior to travel 9 tablet 9  . pantoprazole (PROTONIX) 40 MG tablet TAKE 1 TABLET (40 MG TOTAL) BY MOUTH DAILY. (Patient not taking: Reported on 12/16/2014) 90 tablet 3  . tamsulosin (FLOMAX) 0.4 MG CAPS capsule Take 1 capsule (0.4 mg total) by mouth daily. (Patient not taking: Reported on 12/16/2014) 30 capsule 3   No current facility-administered medications on file prior to visit.   Review of Systems Constitutional: Negative for increased diaphoresis, other activity, appetite or siginficant weight change other than noted HENT: Negative for worsening hearing loss, ear pain, facial swelling, mouth sores and neck stiffness.   Eyes: Negative for other worsening pain, redness or visual disturbance.  Respiratory: Negative for shortness of breath and wheezing  Cardiovascular: Negative for chest pain and palpitations.  Gastrointestinal: Negative for diarrhea, blood in stool, abdominal distention or other pain Genitourinary: Negative for hematuria, flank pain or change in urine volume.  Musculoskeletal: Negative for myalgias or other joint complaints.  Skin: Negative for color change and wound or drainage.  Neurological: Negative for syncope and numbness. other than noted Hematological: Negative for adenopathy. or other swelling Psychiatric/Behavioral: Negative for hallucinations, SI, self-injury, decreased concentration or other worsening agitation.      Objective:   Physical Exam BP 128/88 mmHg  Pulse 81  Temp(Src) 98.5 F (36.9 C) (Oral)  Ht 5\' 9"  (1.753 m)  Wt 219 lb (99.338 kg)  BMI 32.33 kg/m2  SpO2 98% VS noted,  Constitutional: Pt is oriented to person, place, and time. Appears well-developed and well-nourished, in no significant distress Head: Normocephalic  and atraumatic.  Right Ear: External ear normal.  Left Ear: External ear normal.  Nose: Nose normal.  Mouth/Throat: Oropharynx is clear and moist.  Eyes: Conjunctivae and EOM are normal. Pupils are equal, round, and reactive  to light.  Neck: Normal range of motion. Neck supple. No JVD present. No tracheal deviation present or significant neck LA or mass Cardiovascular: Normal rate, regular rhythm, normal heart sounds and intact distal pulses.   Pulmonary/Chest: Effort normal and breath sounds without rales or wheezing  Abdominal: Soft. Bowel sounds are normal. NT. No HSM  Musculoskeletal: Normal range of motion. Exhibits no edema.  Lymphadenopathy:  Has no cervical adenopathy.  Neurological: Pt is alert and oriented to person, place, and time. Pt has normal reflexes. No cranial nerve deficit. Motor grossly intact Skin: Skin is warm and dry. No rash noted.  Psychiatric:  Has normal mood and affect. Behavior is normal.     Assessment & Plan:

## 2014-12-16 NOTE — Assessment & Plan Note (Signed)

## 2015-03-30 ENCOUNTER — Other Ambulatory Visit: Payer: Self-pay | Admitting: Internal Medicine

## 2015-03-30 NOTE — Telephone Encounter (Signed)
Patients wife called to provide clarification on this. States that the patient is wanting to try 2x 20mg  instead of 1 40mg . She states that they have already verified this is covered by insurance

## 2015-03-31 ENCOUNTER — Telehealth: Payer: Self-pay

## 2015-03-31 NOTE — Telephone Encounter (Signed)
Please advise, patient is needing his pantoprazole to be decreased from 40mg  to 20mg . Please send new prescription.

## 2015-04-01 MED ORDER — PANTOPRAZOLE SODIUM 20 MG PO TBEC: 20.0000 mg | DELAYED_RELEASE_TABLET | Freq: Every day | ORAL | Status: DC

## 2015-04-01 NOTE — Telephone Encounter (Signed)
Done erx 

## 2015-04-01 NOTE — Addendum Note (Signed)
Addended by: Biagio Borg on: 04/01/2015 04:55 AM   Modules accepted: Orders, Medications

## 2015-05-03 ENCOUNTER — Other Ambulatory Visit: Payer: Self-pay | Admitting: Internal Medicine

## 2015-05-03 ENCOUNTER — Other Ambulatory Visit: Payer: Self-pay

## 2015-05-03 MED ORDER — PANTOPRAZOLE SODIUM 20 MG PO TBEC: 20.0000 mg | DELAYED_RELEASE_TABLET | Freq: Two times a day (BID) | ORAL | Status: DC

## 2015-05-03 NOTE — Addendum Note (Signed)
Addended by: Biagio Borg on: 05/03/2015 07:04 PM   Modules accepted: Orders

## 2015-05-03 NOTE — Telephone Encounter (Signed)
Pt called back sated that he need 20 mg 2 time daily (so it would be 60 pills for 1 month supply). Please help, he request the change from last month but they did not mention anything about twice daily.

## 2015-05-03 NOTE — Telephone Encounter (Signed)
Done erx 

## 2015-06-15 ENCOUNTER — Ambulatory Visit: Payer: 59 | Admitting: Internal Medicine

## 2015-07-08 ENCOUNTER — Ambulatory Visit: Payer: Self-pay | Admitting: Family Medicine

## 2015-07-08 ENCOUNTER — Telehealth: Payer: Self-pay | Admitting: Internal Medicine

## 2015-07-08 NOTE — Telephone Encounter (Signed)
Patient Name: Jonathan Watkins  DOB: [birth year]    Initial Comment Caller States her husband is having shoulder pain, it is getting worse, starts at his neck and goes down does shoulder   Nurse Assessment  Nurse: Orvan Seen, RN, Jacquilin Date/Time (Eastern Time): 07/08/2015 8:30:54 AM  Confirm and document reason for call. If symptomatic, describe symptoms. You must click the next button to save text entered. ---Caller States her husband is having shoulder pain, it is getting worse, starts at his neck and goes down does shoulder. Patient states he does not have pain at rest but with movement the pain is a 9/10. Patient gets relief with motrin but only for 4 hours  Has the patient traveled out of the country within the last 30 days? ---No  Does the patient have any new or worsening symptoms? ---Yes  Will a triage be completed? ---Yes  Related visit to physician within the last 2 weeks? ---No  Does the PT have any chronic conditions? (i.e. diabetes, asthma, etc.) ---No  Is this a behavioral health or substance abuse call? ---No     Guidelines    Guideline Title Affirmed Question Affirmed Notes  Shoulder Pain [1] Unable to use arm at all AND [2] because of shoulder pain or stiffness    Final Disposition User   See Physician within 24 Hours Atkins, RN, Jacquilin    Comments  Checked PCP office but no appointment open. Appointment made for today at 13 with Betty Martinique at [place] [facility]   Disagree/Comply: Leta Baptist

## 2015-07-13 ENCOUNTER — Emergency Department (HOSPITAL_COMMUNITY): Payer: 59

## 2015-07-13 ENCOUNTER — Encounter (HOSPITAL_COMMUNITY): Payer: Self-pay | Admitting: Vascular Surgery

## 2015-07-13 ENCOUNTER — Emergency Department (HOSPITAL_COMMUNITY)
Admission: EM | Admit: 2015-07-13 | Discharge: 2015-07-13 | Disposition: A | Discharge status: Home / Self Care | Payer: 59 | Attending: Emergency Medicine | Admitting: Emergency Medicine

## 2015-07-13 DIAGNOSIS — E119 Type 2 diabetes mellitus without complications: Secondary | ICD-10-CM | POA: Diagnosis not present

## 2015-07-13 DIAGNOSIS — Z79899 Other long term (current) drug therapy: Secondary | ICD-10-CM | POA: Insufficient documentation

## 2015-07-13 DIAGNOSIS — E785 Hyperlipidemia, unspecified: Secondary | ICD-10-CM | POA: Diagnosis not present

## 2015-07-13 DIAGNOSIS — R1031 Right lower quadrant pain: Secondary | ICD-10-CM | POA: Diagnosis present

## 2015-07-13 DIAGNOSIS — Z791 Long term (current) use of non-steroidal anti-inflammatories (NSAID): Secondary | ICD-10-CM | POA: Insufficient documentation

## 2015-07-13 DIAGNOSIS — K219 Gastro-esophageal reflux disease without esophagitis: Secondary | ICD-10-CM | POA: Diagnosis not present

## 2015-07-13 DIAGNOSIS — N200 Calculus of kidney: Secondary | ICD-10-CM | POA: Insufficient documentation

## 2015-07-13 DIAGNOSIS — Z7984 Long term (current) use of oral hypoglycemic drugs: Secondary | ICD-10-CM | POA: Insufficient documentation

## 2015-07-13 HISTORY — DX: Type 2 diabetes mellitus without complications: E11.9

## 2015-07-13 LAB — CBC WITH DIFFERENTIAL/PLATELET
BASOS ABS: 0 10*3/uL (ref 0.0–0.1)
BASOS PCT: 0 %
Eosinophils Absolute: 0.1 10*3/uL (ref 0.0–0.7)
Eosinophils Relative: 1 %
HCT: 45.3 % (ref 39.0–52.0)
HEMOGLOBIN: 15.1 g/dL (ref 13.0–17.0)
LYMPHS PCT: 12 %
Lymphs Abs: 1.4 10*3/uL (ref 0.7–4.0)
MCH: 28.7 pg (ref 26.0–34.0)
MCHC: 33.3 g/dL (ref 30.0–36.0)
MCV: 86.1 fL (ref 78.0–100.0)
Monocytes Absolute: 0.5 10*3/uL (ref 0.1–1.0)
Monocytes Relative: 4 %
NEUTROS ABS: 9.5 10*3/uL — AB (ref 1.7–7.7)
NEUTROS PCT: 83 %
Platelets: 204 10*3/uL (ref 150–400)
RBC: 5.26 MIL/uL (ref 4.22–5.81)
RDW: 13.1 % (ref 11.5–15.5)
WBC: 11.5 10*3/uL — ABNORMAL HIGH (ref 4.0–10.5)

## 2015-07-13 LAB — URINALYSIS, ROUTINE W REFLEX MICROSCOPIC
Bilirubin Urine: NEGATIVE
GLUCOSE, UA: 100 mg/dL — AB
Ketones, ur: NEGATIVE mg/dL
LEUKOCYTES UA: NEGATIVE
NITRITE: NEGATIVE
PH: 8 (ref 5.0–8.0)
Protein, ur: NEGATIVE mg/dL
SPECIFIC GRAVITY, URINE: 1.015 (ref 1.005–1.030)

## 2015-07-13 LAB — COMPREHENSIVE METABOLIC PANEL
ALT: 27 U/L (ref 17–63)
ANION GAP: 11 (ref 5–15)
AST: 24 U/L (ref 15–41)
Albumin: 4.5 g/dL (ref 3.5–5.0)
Alkaline Phosphatase: 47 U/L (ref 38–126)
BILIRUBIN TOTAL: 0.8 mg/dL (ref 0.3–1.2)
BUN: 11 mg/dL (ref 6–20)
CALCIUM: 9.9 mg/dL (ref 8.9–10.3)
CO2: 27 mmol/L (ref 22–32)
Chloride: 99 mmol/L — ABNORMAL LOW (ref 101–111)
Creatinine, Ser: 1.12 mg/dL (ref 0.61–1.24)
GLUCOSE: 165 mg/dL — AB (ref 65–99)
POTASSIUM: 4 mmol/L (ref 3.5–5.1)
Sodium: 137 mmol/L (ref 135–145)
TOTAL PROTEIN: 7.7 g/dL (ref 6.5–8.1)

## 2015-07-13 LAB — URINE MICROSCOPIC-ADD ON
Bacteria, UA: NONE SEEN
SQUAMOUS EPITHELIAL / LPF: NONE SEEN
WBC, UA: NONE SEEN WBC/hpf (ref 0–5)

## 2015-07-13 MED ORDER — TAMSULOSIN HCL 0.4 MG PO CAPS: 0.4000 mg | ORAL_CAPSULE | Freq: Every day | ORAL | Status: DC

## 2015-07-13 MED ORDER — KETOROLAC TROMETHAMINE 30 MG/ML IJ SOLN
30.0000 mg | Freq: Once | INTRAMUSCULAR | Status: AC
  Administered 2015-07-13: 30 mg via INTRAVENOUS
  Filled 2015-07-13: qty 1

## 2015-07-13 MED ORDER — OXYCODONE-ACETAMINOPHEN 5-325 MG PO TABS
1.0000 | ORAL_TABLET | Freq: Once | ORAL | Status: AC
  Administered 2015-07-13: 1 via ORAL
  Filled 2015-07-13: qty 1

## 2015-07-13 MED ORDER — SODIUM CHLORIDE 0.9 % IV BOLUS (SEPSIS)
1000.0000 mL | Freq: Once | INTRAVENOUS | Status: AC
  Administered 2015-07-13: 1000 mL via INTRAVENOUS

## 2015-07-13 MED ORDER — HYDROMORPHONE HCL 1 MG/ML IJ SOLN
1.0000 mg | Freq: Once | INTRAMUSCULAR | Status: AC
  Administered 2015-07-13: 1 mg via INTRAVENOUS
  Filled 2015-07-13: qty 1

## 2015-07-13 MED ORDER — OXYCODONE-ACETAMINOPHEN 5-325 MG PO TABS: 1.0000 | ORAL_TABLET | ORAL | Status: DC | PRN

## 2015-07-13 MED ORDER — ONDANSETRON HCL 4 MG/2ML IJ SOLN
4.0000 mg | Freq: Once | INTRAMUSCULAR | Status: AC
  Administered 2015-07-13: 4 mg via INTRAVENOUS
  Filled 2015-07-13: qty 2

## 2015-07-13 MED ORDER — ONDANSETRON HCL 4 MG PO TABS: 4.0000 mg | ORAL_TABLET | Freq: Three times a day (TID) | ORAL | Status: DC | PRN

## 2015-07-13 MED ORDER — MORPHINE SULFATE (PF) 4 MG/ML IV SOLN
4.0000 mg | Freq: Once | INTRAVENOUS | Status: AC
  Administered 2015-07-13: 4 mg via INTRAVENOUS
  Filled 2015-07-13: qty 1

## 2015-07-13 NOTE — ED Provider Notes (Signed)
CSN: [id number]     Arrival date & time 07/13/15  [id number] History   First MD Initiated Contact with Patient 07/13/15 [phone]     Chief Complaint  Patient presents with  . Flank Pain     (Consider location/radiation/quality/duration/timing/severity/associated sxs/prior Treatment) HPI Comments: 40yo M w/ PMH including T2DM, kidney stones, GERD who p/w R flank pain and vomiting. Last night, the patient began having pain in his right flank that radiates to his right lower quadrant. The pain is constant and severe, became worse this morning. This pain is very similar to previous episodes of kidney stones. He has had associated nausea and vomiting. He reports mild diarrhea for the past 2 days. No dysuria or hematuria. No fevers. He does report that the pain seems to be moving towards the middle of his lower abdomen when he used to be on his right side.  Patient is a 40 y.o. male presenting with flank pain. The history is provided by the patient.  Flank Pain    Past Medical History  Diagnosis Date  . Abdominal pain, left lower quadrant 10/29/2008  . ALLERGIC RHINITIS 10/29/2008  . ELEVATED BLOOD PRESSURE WITHOUT DIAGNOSIS OF HYPERTENSION 10/22/2007  . ERECTILE DYSFUNCTION 01/26/2008  . GERD 10/22/2007  . Headache(784.0) 05/02/2009  . HEMATOCHEZIA 11/30/2008  . HYPERLIPIDEMIA 03/21/2007  . NECK PAIN 12/15/2009  . Other dysphagia 05/02/2009  . Personal history of colonic polyps 12/28/2008  . SEIZURE DISORDER 03/21/2007  . Renal stones 03/21/2011  . Impaired glucose tolerance 03/21/2011  . Diabetes mellitus without complication [facility])    Past Surgical History  Procedure Laterality Date  . Nasal surgury    . Anal fissure repair     No family history on file. Social History  Substance Use Topics  . Smoking status: Never Smoker   . Smokeless tobacco: Never Used  . Alcohol Use: Yes     Comment: 1-2 per month, mixed drink or beer    Review of Systems  Genitourinary: Positive for flank pain.   10 Systems  reviewed and are negative for acute change except as noted in the HPI.   Allergies  Review of patient's allergies indicates no known allergies.  Home Medications   Prior to Admission medications   Medication Sig Start Date End Date Taking? Authorizing Provider  ibuprofen (ADVIL,MOTRIN) 200 MG tablet Take 200 mg by mouth every 6 (six) hours as needed for moderate pain.   Yes Historical Provider, MD  metFORMIN (GLUCOPHAGE-XR) 500 MG 24 hr tablet Take 1 tablet (500 mg total) by mouth daily with breakfast. 12/16/14  Yes Biagio Borg, MD  ranitidine (ZANTAC) 150 MG capsule Take 150 mg by mouth daily as needed for heartburn.   Yes Historical Provider, MD  ondansetron (ZOFRAN) 4 MG tablet Take 1 tablet (4 mg total) by mouth every 8 (eight) hours as needed for nausea or vomiting. 07/13/15   Sharlett Iles, MD  oxyCODONE-acetaminophen (PERCOCET) 5-325 MG tablet Take 1-2 tablets by mouth every 4 (four) hours as needed for severe pain. 07/13/15   Sharlett Iles, MD  tamsulosin (FLOMAX) 0.4 MG CAPS capsule Take 1 capsule (0.4 mg total) by mouth daily. 07/13/15   Wenda Overland Braxton Weisbecker, MD   BP 137/64 mmHg  Pulse 83  Temp(Src) 97.8 F (36.6 C) (Oral)  Resp 16  SpO2 98% Physical Exam  Constitutional: He is oriented to person, place, and time. He appears well-developed and well-nourished. He appears distressed.  In distress due to pain  HENT:  Head:  Normocephalic and atraumatic.  Moist mucous membranes  Eyes: Conjunctivae are normal.  Neck: Neck supple.  Cardiovascular: Normal rate, regular rhythm and normal heart sounds.   No murmur heard. Pulmonary/Chest: Effort normal and breath sounds normal.  Abdominal: Soft. Bowel sounds are normal. He exhibits no distension. There is tenderness (suprapubic TTP).  Genitourinary:  R CVA tenderness  Musculoskeletal: He exhibits no edema.  Neurological: He is alert and oriented to person, place, and time.  Fluent speech  Skin: Skin is warm and dry.   Psychiatric: Judgment normal.  Distressed, rolling in bed  Nursing note and vitals reviewed.   ED Course  Procedures (including critical care time) Labs Review Labs Reviewed  COMPREHENSIVE METABOLIC PANEL - Abnormal; Notable for the following:    Chloride 99 (*)    Glucose, Bld 165 (*)    All other components within normal limits  CBC WITH DIFFERENTIAL/PLATELET - Abnormal; Notable for the following:    WBC 11.5 (*)    Neutro Abs 9.5 (*)    All other components within normal limits  URINALYSIS, ROUTINE W REFLEX MICROSCOPIC (NOT AT [facility]) - Abnormal; Notable for the following:    APPearance CLOUDY (*)    Glucose, UA 100 (*)    Hgb urine dipstick MODERATE (*)    All other components within normal limits  URINE MICROSCOPIC-ADD ON    Imaging Review US Renal  07/13/2015  CLINICAL DATA:  Right-sided flank pain and hematuria EXAM: RENAL / URINARY TRACT ULTRASOUND COMPLETE COMPARISON:  CT abdomen and pelvis February 22, 2014 FINDINGS: Right Kidney: Length: 12.0 cm. Echogenicity and renal cortical thickness are within normal limits. No mass or perinephric fluid visualized. There is minimal pelvicaliectasis on the right. No demonstrable ureterectasis. No sonographically demonstrable calculus. Left Kidney: Length: 11.5 cm. Echogenicity and renal cortical thickness are within normal limits. No mass, perinephric fluid, or hydronephrosis visualized. No sonographically demonstrable calculus or ureterectasis. Bladder: Appears normal for degree of bladder distention. IMPRESSION: Slight fullness of the right renal collecting system. Study otherwise unremarkable. If there is concern for potential ureteral calculus causing the pelvicaliectasis on the right, noncontrast enhanced CT abdomen and pelvis would be the imaging study of choice to further evaluate. Electronically Signed   By: Lowella Grip III M.D.   On: 07/13/2015 12:20   I have personally reviewed and evaluated these lab results as part of my  medical decision-making.  Medications  ondansetron (ZOFRAN) injection 4 mg (4 mg Intravenous Given 07/13/15 1007)  morphine 4 MG/ML injection 4 mg (4 mg Intravenous Given 07/13/15 1007)  sodium chloride 0.9 % bolus 1,000 mL (0 mLs Intravenous Stopped 07/13/15 1125)  HYDROmorphone (DILAUDID) injection 1 mg (1 mg Intravenous Given 07/13/15 1023)  HYDROmorphone (DILAUDID) injection 1 mg (1 mg Intravenous Given 07/13/15 1122)  HYDROmorphone (DILAUDID) injection 1 mg (1 mg Intravenous Given 07/13/15 1323)  oxyCODONE-acetaminophen (PERCOCET/ROXICET) 5-325 MG per tablet 1 tablet (1 tablet Oral Given 07/13/15 1323)  ondansetron (ZOFRAN) injection 4 mg (4 mg Intravenous Given 07/13/15 1505)  ketorolac (TORADOL) 30 MG/ML injection 30 mg (30 mg Intravenous Given 07/13/15 1505)  oxyCODONE-acetaminophen (PERCOCET/ROXICET) 5-325 MG per tablet 1 tablet (1 tablet Oral Given 07/13/15 1636)     MDM   Final diagnoses:  Right kidney stone   Pt w/ h/o kidney stones p/w R flank pain radiating into RLQ That felt like previous episodes of kidney stone. He was in moderate distress due to pain on exam, vital signs stable. Right CVA tenderness as well as suprapubic tenderness noted.  Gave the patient Zofran, IV fluid bolus, morphine, and later Dilaudid.  Labwork shows mild hematuria without any signs of infection, creatinine 1.12, WBC 11.5. Obtain renal ultrasound which showed slight fullness of right renal collecting system without obvious hydronephrosis. On reexamination after receiving above medications including Toradol, the patient stated that his pain had improved to 3/10. He has followed at [facility] urology previously and has never required intervention previously with passage of stones, therefore I feel he is safe for discharge with trial of passage. Provided with Percocet and Zofran to use at home as well as Flomax. He will contact urology clinic for follow-up appointment. Extensively reviewed return precautions including  fever, intractable vomiting, or intractable pain. Patient voiced understanding and felt comfortable with plan. Discharged in satisfactory condition.  Sharlett Iles, MD 07/13/15 [phone]

## 2015-07-13 NOTE — Discharge Instructions (Signed)
Kidney Stones °Kidney stones (urolithiasis) are deposits that form inside your kidneys. The intense pain is caused by the stone moving through the urinary tract. When the stone moves, the ureter goes into spasm around the stone. The stone is usually passed in the urine.  °CAUSES  °· A disorder that makes certain neck glands produce too much parathyroid hormone (primary hyperparathyroidism). °· A buildup of uric acid crystals, similar to gout in your joints. °· Narrowing (stricture) of the ureter. °· A kidney obstruction present at birth (congenital obstruction). °· Previous surgery on the kidney or ureters. °· Numerous kidney infections. °SYMPTOMS  °· Feeling sick to your stomach (nauseous). °· Throwing up (vomiting). °· Blood in the urine (hematuria). °· Pain that usually spreads (radiates) to the groin. °· Frequency or urgency of urination. °DIAGNOSIS  °· Taking a history and physical exam. °· Blood or urine tests. °· CT scan. °· Occasionally, an examination of the inside of the urinary bladder (cystoscopy) is performed. °TREATMENT  °· Observation. °· Increasing your fluid intake. °· Extracorporeal shock wave lithotripsy--This is a noninvasive procedure that uses shock waves to break up kidney stones. °· Surgery may be needed if you have severe pain or persistent obstruction. There are various surgical procedures. Most of the procedures are performed with the use of small instruments. Only small incisions are needed to accommodate these instruments, so recovery time is minimized. °The size, location, and chemical composition are all important variables that will determine the proper choice of action for you. Talk to your health care provider to better understand your situation so that you will minimize the risk of injury to yourself and your kidney.  °HOME CARE INSTRUCTIONS  °· Drink enough water and fluids to keep your urine clear or pale yellow. This will help you to pass the stone or stone fragments. °· Strain  all urine through the provided strainer. Keep all particulate matter and stones for your health care provider to see. The stone causing the pain may be as small as a grain of salt. It is very important to use the strainer each and every time you pass your urine. The collection of your stone will allow your health care provider to analyze it and verify that a stone has actually passed. The stone analysis will often identify what you can do to reduce the incidence of recurrences. °· Only take over-the-counter or prescription medicines for pain, discomfort, or fever as directed by your health care provider. °· Keep all follow-up visits as told by your health care provider. This is important. °· Get follow-up X-rays if required. The absence of pain does not always mean that the stone has passed. It may have only stopped moving. If the urine remains completely obstructed, it can cause loss of kidney function or even complete destruction of the kidney. It is your responsibility to make sure X-rays and follow-ups are completed. Ultrasounds of the kidney can show blockages and the status of the kidney. Ultrasounds are not associated with any radiation and can be performed easily in a matter of minutes. °· Make changes to your daily diet as told by your health care provider. You may be told to: °¨ Limit the amount of salt that you eat. °¨ Eat 5 or more servings of fruits and vegetables each day. °¨ Limit the amount of meat, poultry, fish, and eggs that you eat. °· Collect a 24-hour urine sample as told by your health care provider. You may need to collect another urine sample every 6-12   months. °SEEK MEDICAL CARE IF: °· You experience pain that is progressive and unresponsive to any pain medicine you have been prescribed. °SEEK IMMEDIATE MEDICAL CARE IF:  °· Pain cannot be controlled with the prescribed medicine. °· You have a fever or shaking chills. °· The severity or intensity of pain increases over 18 hours and is not  relieved by pain medicine. °· You develop a new onset of abdominal pain. °· You feel faint or pass out. °· You are unable to urinate. °  °This information is not intended to replace advice given to you by your health care provider. Make sure you discuss any questions you have with your health care provider. °  °Document Released: 01/29/2005 Document Revised: 10/20/2014 Document Reviewed: 07/02/2012 °Elsevier Interactive Patient Education ©2016 [employer] ° °

## 2015-07-13 NOTE — ED Notes (Signed)
Pt reports to the ED for eval of right flank and RLQ abd pain. The pain began last night but became significantly worse this am. Pt pale, diaphoretic, and unable to sit still. Has hx of nephrolithiasis and reports this feels similar. Was admitted in the past for the stone for pain control. Pt also having some associated N/V. Pt denies any hematuria. He is afebrile at this time. Pt denies any dysuria and has been able to pass urine this am. Pt A&Ox4 and resp e/u.

## 2015-12-05 ENCOUNTER — Telehealth: Payer: Self-pay | Admitting: Emergency Medicine

## 2015-12-05 DIAGNOSIS — Z0000 Encounter for general adult medical examination without abnormal findings: Secondary | ICD-10-CM

## 2015-12-05 NOTE — Telephone Encounter (Signed)
Pts wife called and stated she cancelled the appt on 06/15/15 but is still getting a no show fee. Can you remove this please. Thanks.

## 2015-12-05 NOTE — Telephone Encounter (Signed)
Pts wife called and asked if patients labs can be put in a week before his appt. She also asked if A1C and vitamin D can be included in those labs. Thanks.

## 2015-12-06 NOTE — Telephone Encounter (Signed)
Lab orders placed. Patient wife aware that patient can come in before appointment.

## 2015-12-19 NOTE — Telephone Encounter (Signed)
email sent to charge correction to remove no show fee °

## 2015-12-21 ENCOUNTER — Other Ambulatory Visit (INDEPENDENT_AMBULATORY_CARE_PROVIDER_SITE_OTHER): Payer: 59

## 2015-12-21 DIAGNOSIS — R7989 Other specified abnormal findings of blood chemistry: Secondary | ICD-10-CM

## 2015-12-21 DIAGNOSIS — Z0000 Encounter for general adult medical examination without abnormal findings: Secondary | ICD-10-CM | POA: Diagnosis not present

## 2015-12-21 LAB — LIPID PANEL
CHOL/HDL RATIO: 6
Cholesterol: 232 mg/dL — ABNORMAL HIGH (ref 0–200)
HDL: 36 mg/dL — AB (ref >39.00)

## 2015-12-21 LAB — BASIC METABOLIC PANEL
BUN: 14 mg/dL (ref 6–23)
CALCIUM: 10 mg/dL (ref 8.4–10.5)
CHLORIDE: 100 meq/L (ref 96–112)
CO2: 31 meq/L (ref 19–32)
Creatinine, Ser: 0.91 mg/dL (ref 0.40–1.50)
GFR: 97.66 mL/min (ref >60.00)
GLUCOSE: 138 mg/dL — AB (ref 70–99)
POTASSIUM: 5 meq/L (ref 3.5–5.1)
SODIUM: 140 meq/L (ref 135–145)

## 2015-12-21 LAB — HEPATIC FUNCTION PANEL
ALBUMIN: 4.6 g/dL (ref 3.5–5.2)
ALT: 17 U/L (ref 0–53)
AST: 11 U/L (ref 0–37)
Alkaline Phosphatase: 44 U/L (ref 39–117)
BILIRUBIN TOTAL: 0.6 mg/dL (ref 0.2–1.2)
Bilirubin, Direct: 0.1 mg/dL (ref 0.0–0.3)
Total Protein: 7.5 g/dL (ref 6.0–8.3)

## 2015-12-21 LAB — CBC
HEMATOCRIT: 46.7 % (ref 39.0–52.0)
Hemoglobin: 15.9 g/dL (ref 13.0–17.0)
MCHC: 34 g/dL (ref 30.0–36.0)
MCV: 86 fl (ref 78.0–100.0)
Platelets: 192 10*3/uL (ref 150.0–400.0)
RBC: 5.43 Mil/uL (ref 4.22–5.81)
RDW: 13.5 % (ref 11.5–15.5)
WBC: 4.3 10*3/uL (ref 4.0–10.5)

## 2015-12-21 LAB — VITAMIN D 25 HYDROXY (VIT D DEFICIENCY, FRACTURES): VITD: 18.52 ng/mL — ABNORMAL LOW (ref 30.00–100.00)

## 2015-12-21 LAB — MICROALBUMIN / CREATININE URINE RATIO
CREATININE, U: 133.2 mg/dL
MICROALB UR: 1.3 mg/dL (ref 0.0–1.9)
MICROALB/CREAT RATIO: 1 mg/g (ref 0.0–30.0)

## 2015-12-21 LAB — LDL CHOLESTEROL, DIRECT: Direct LDL: 106 mg/dL

## 2015-12-21 LAB — HEMOGLOBIN A1C: Hgb A1c MFr Bld: 7.1 % — ABNORMAL HIGH (ref 4.6–6.5)

## 2015-12-21 LAB — PSA: PSA: 0.23 ng/mL (ref 0.10–4.00)

## 2015-12-21 LAB — TSH: TSH: 1.67 u[IU]/mL (ref 0.35–4.50)

## 2015-12-29 ENCOUNTER — Ambulatory Visit (INDEPENDENT_AMBULATORY_CARE_PROVIDER_SITE_OTHER): Payer: 59 | Admitting: Internal Medicine

## 2015-12-29 ENCOUNTER — Encounter: Payer: Self-pay | Admitting: Internal Medicine

## 2015-12-29 VITALS — BP 138/78 | HR 77 | Temp 98.3°F | Resp 20 | Wt 218.0 lb

## 2015-12-29 DIAGNOSIS — N529 Male erectile dysfunction, unspecified: Secondary | ICD-10-CM | POA: Diagnosis not present

## 2015-12-29 DIAGNOSIS — I10 Essential (primary) hypertension: Secondary | ICD-10-CM

## 2015-12-29 DIAGNOSIS — Z0000 Encounter for general adult medical examination without abnormal findings: Secondary | ICD-10-CM

## 2015-12-29 DIAGNOSIS — Z23 Encounter for immunization: Secondary | ICD-10-CM

## 2015-12-29 DIAGNOSIS — E119 Type 2 diabetes mellitus without complications: Secondary | ICD-10-CM

## 2015-12-29 DIAGNOSIS — Z0001 Encounter for general adult medical examination with abnormal findings: Secondary | ICD-10-CM

## 2015-12-29 DIAGNOSIS — E785 Hyperlipidemia, unspecified: Secondary | ICD-10-CM | POA: Diagnosis not present

## 2015-12-29 MED ORDER — LOSARTAN POTASSIUM 25 MG PO TABS: 25.0000 mg | ORAL_TABLET | Freq: Every day | ORAL | 3 refills | Status: DC

## 2015-12-29 MED ORDER — TADALAFIL 20 MG PO TABS: 20.0000 mg | ORAL_TABLET | Freq: Every day | ORAL | 11 refills | Status: DC | PRN

## 2015-12-29 MED ORDER — METFORMIN HCL ER 500 MG PO TB24: 1000.0000 mg | ORAL_TABLET | Freq: Every day | ORAL | 3 refills | Status: DC

## 2015-12-29 NOTE — Assessment & Plan Note (Signed)

## 2015-12-29 NOTE — Assessment & Plan Note (Signed)
Mild uncontrolled, goal < 70, o/w stable overall by history and exam, recent data reviewed with pt, and pt to continue medical treatment as before as declines statin for now,for improved Diet,  to f/u any worsening symptoms or concerns  Lab Results  Component Value Date   LDLCALC 113 (H) 02/03/2010

## 2015-12-29 NOTE — Progress Notes (Signed)
Subjective:    Patient ID: Jonathan Watkins, male    DOB: [birth year], 40 y.o.   MRN: [id number]  HPI  Here for wellness and f/u;  Overall doing ok;    Pt denies worsening depressive symptoms, suicidal ideation or panic. No fever, night sweats, wt loss, loss of appetite, or other constitutional symptoms.  Pt states good ability with ADL's, has low fall risk, home safety reviewed and adequate, no other significant changes in hearing or vision, and only occasionally active with exercise, plans to start doing more as Triglycerides have increased, fortunately wt has seen little change Wt Readings from Last 3 Encounters:  12/29/15 218 lb (98.9 kg)  12/16/14 219 lb (99.3 kg)  11/18/13 222 lb 4 oz (100.8 kg)   Pt denies Chest pain, worsening SOB, DOE, wheezing, orthopnea, PND, worsening LE edema, palpitations, dizziness or syncope.  Pt denies neurological change such as new headache, facial or extremity weakness.  Pt denies polydipsia, polyuria, or low sugar symptoms. Pt states overall good compliance with treatment and medications, good tolerability, and has been trying to follow appropriate diabetic diet, just cant understand why triglycerides have increased,. Also has been having worsening ED symptoms recently, and asks for ED med. CBG's have been low 100's, tolerating once daily metformin ER 500, takes in the evening, since taking in the am seemed to lead to daily reproducible HA each day for 2 wks with this Past Medical History:  Diagnosis Date  . Abdominal pain, left lower quadrant 10/29/2008  . ALLERGIC RHINITIS 10/29/2008  . Diabetes mellitus without complication ([place])   . ELEVATED BLOOD PRESSURE WITHOUT DIAGNOSIS OF HYPERTENSION 10/22/2007  . ERECTILE DYSFUNCTION 01/26/2008  . GERD 10/22/2007  . Headache(784.0) 05/02/2009  . HEMATOCHEZIA 11/30/2008  . HYPERLIPIDEMIA 03/21/2007  . Impaired glucose tolerance 03/21/2011  . NECK PAIN 12/15/2009  . Other dysphagia 05/02/2009  . Personal history of colonic  polyps 12/28/2008  . Renal stones 03/21/2011  . SEIZURE DISORDER 03/21/2007   Past Surgical History:  Procedure Laterality Date  . ANAL FISSURE REPAIR    . nasal surgury      reports that he has never smoked. He has never used smokeless tobacco. He reports that he drinks alcohol. He reports that he does not use drugs. family history is not on file. No Known Allergies Current Outpatient Prescriptions on File Prior to Visit  Medication Sig Dispense Refill  . ibuprofen (ADVIL,MOTRIN) 200 MG tablet Take 200 mg by mouth every 6 (six) hours as needed for moderate pain.    Marland Kitchen ondansetron (ZOFRAN) 4 MG tablet Take 1 tablet (4 mg total) by mouth every 8 (eight) hours as needed for nausea or vomiting. 6 tablet 0  . oxyCODONE-acetaminophen (PERCOCET) 5-325 MG tablet Take 1-2 tablets by mouth every 4 (four) hours as needed for severe pain. 16 tablet 0  . ranitidine (ZANTAC) 150 MG capsule Take 150 mg by mouth daily as needed for heartburn.    . tamsulosin (FLOMAX) 0.4 MG CAPS capsule Take 1 capsule (0.4 mg total) by mouth daily. 14 capsule 0  . [DISCONTINUED] pantoprazole (PROTONIX) 20 MG tablet Take 1 tablet (20 mg total) by mouth 2 (two) times daily. (Patient not taking: Reported on 07/13/2015) 60 tablet 8   No current facility-administered medications on file prior to visit.    Review of Systems Constitutional: Negative for increased diaphoresis, or other activity, appetite or siginficant weight change other than noted HENT: Negative for worsening hearing loss, ear pain, facial swelling, mouth sores and neck  stiffness.   Eyes: Negative for other worsening pain, redness or visual disturbance.  Respiratory: Negative for choking or stridor Cardiovascular: Negative for other chest pain and palpitations.  Gastrointestinal: Negative for worsening diarrhea, blood in stool, or abdominal distention Genitourinary: Negative for hematuria, flank pain or change in urine volume.  Musculoskeletal: Negative for  myalgias or other joint complaints.  Skin: Negative for other color change and wound or drainage.  Neurological: Negative for syncope and numbness. other than noted Hematological: Negative for adenopathy. or other swelling Psychiatric/Behavioral: Negative for hallucinations, SI, self-injury, decreased concentration or other worsening agitation.  All other system neg per pt    Objective:   Physical Exam BP 138/78   Pulse 77   Temp 98.3 F (36.8 C) (Oral)   Resp 20   Wt 218 lb (98.9 kg)   SpO2 98%   BMI 32.19 kg/m  VS noted, obese Constitutional: Pt is oriented to person, place, and time. Appears well-developed and well-nourished, in no significant distress Head: Normocephalic and atraumatic  Eyes: Conjunctivae and EOM are normal. Pupils are equal, round, and reactive to light Right Ear: External ear normal.  Left Ear: External ear normal Nose: Nose normal.  Mouth/Throat: Oropharynx is clear and moist  Neck: Normal range of motion. Neck supple. No JVD present. No tracheal deviation present or significant neck LA or mass Cardiovascular: Normal rate, regular rhythm, normal heart sounds and intact distal pulses.   Pulmonary/Chest: Effort normal and breath sounds without rales or wheezing  Abdominal: Soft. Bowel sounds are normal. NT. No HSM  Musculoskeletal: Normal range of motion. Exhibits no edema Lymphadenopathy: Has no cervical adenopathy.  Neurological: Pt is alert and oriented to person, place, and time. Pt has normal reflexes. No cranial nerve deficit. Motor grossly intact Skin: Skin is warm and dry. No rash noted or new ulcers Psychiatric:  Has normal mood and affect. Behavior is normal.  No other new exam findings    Assessment & Plan:

## 2015-12-29 NOTE — Assessment & Plan Note (Signed)
Given new standard of 130/80, will add losartan 25 qd,  to f/u any worsening symptoms or concerns

## 2015-12-29 NOTE — Assessment & Plan Note (Addendum)
Mild uncontrolled, to incresae the metformin 500 ER to 2 tabs daily, o/w stable overall by history and exam, recent data reviewed with pt, and pt to continue medical treatment as before,  to f/u any worsening symptoms or concerns Lab Results  Component Value Date   HGBA1C 7.1 (H) 12/21/2015  In addition to the time spent performing CPE, I spent an additional 25 minutes face to face,in which greater than 50% of this time was spent in counseling and coordination of care for patient's acute illness as documented.

## 2015-12-29 NOTE — Assessment & Plan Note (Signed)
Ok for cialis 20 mg prn,  to f/u any worsening symptoms or concerns

## 2015-12-29 NOTE — Progress Notes (Signed)
Pre visit review using our clinic review tool, if applicable. No additional management support is needed unless otherwise documented below in the visit note. 

## 2015-12-29 NOTE — Patient Instructions (Addendum)
You had the flu shot today  Please take all new medication as prescribed - the losartan 25 mg per day, and the cialis if needed  OK to increase the metformin to 2 tabs per day  Please continue all other medications as before, and refills have been done if requested.  Please have the pharmacy call with any other refills you may need.  Please continue your efforts at being more active, low cholesterol diet, and weight control.  You are otherwise up to date with prevention measures today.  Please keep your appointments with your specialists as you may have planned  Please return in 6 months, or sooner if needed, with Lab testing done 3-5 days before

## 2016-06-04 ENCOUNTER — Other Ambulatory Visit: Payer: Self-pay | Admitting: Internal Medicine

## 2016-06-27 ENCOUNTER — Ambulatory Visit: Payer: 59 | Admitting: Internal Medicine

## 2016-08-21 ENCOUNTER — Other Ambulatory Visit: Payer: 59

## 2016-08-21 ENCOUNTER — Telehealth: Payer: Self-pay | Admitting: Internal Medicine

## 2016-08-21 DIAGNOSIS — Z0000 Encounter for general adult medical examination without abnormal findings: Secondary | ICD-10-CM

## 2016-08-21 NOTE — Telephone Encounter (Signed)
Pt would like orders for Vitamin D, Cholesterol and A1C and BS put in so he can make an appointment to comein and discuss

## 2016-08-21 NOTE — Telephone Encounter (Signed)
Done

## 2016-08-22 ENCOUNTER — Ambulatory Visit: Payer: 59 | Admitting: Internal Medicine

## 2016-09-27 ENCOUNTER — Telehealth: Payer: Self-pay | Admitting: Internal Medicine

## 2016-09-27 DIAGNOSIS — E559 Vitamin D deficiency, unspecified: Secondary | ICD-10-CM

## 2016-09-27 NOTE — Telephone Encounter (Signed)
yes

## 2016-09-27 NOTE — Telephone Encounter (Signed)
Spouse called in stating that patient will be coming in to do labs.  Would like to make sure a check for Vit D is entered.  Please follow up with patient in regard.

## 2016-09-27 NOTE — Telephone Encounter (Signed)
Done

## 2016-09-28 NOTE — Telephone Encounter (Signed)
Notified pt spouse

## 2016-11-08 ENCOUNTER — Telehealth: Payer: Self-pay

## 2016-11-08 ENCOUNTER — Other Ambulatory Visit (INDEPENDENT_AMBULATORY_CARE_PROVIDER_SITE_OTHER): Payer: 59

## 2016-11-08 DIAGNOSIS — E559 Vitamin D deficiency, unspecified: Secondary | ICD-10-CM | POA: Diagnosis not present

## 2016-11-08 DIAGNOSIS — Z0000 Encounter for general adult medical examination without abnormal findings: Secondary | ICD-10-CM | POA: Diagnosis not present

## 2016-11-08 LAB — URINALYSIS, ROUTINE W REFLEX MICROSCOPIC
Bilirubin Urine: NEGATIVE
KETONES UR: NEGATIVE
LEUKOCYTES UA: NEGATIVE
Nitrite: NEGATIVE
PH: 5.5 (ref 5.0–8.0)
RBC / HPF: NONE SEEN (ref 0–?)
SPECIFIC GRAVITY, URINE: 1.02 (ref 1.000–1.030)
TOTAL PROTEIN, URINE-UPE24: NEGATIVE
URINE GLUCOSE: NEGATIVE
Urobilinogen, UA: 0.2 (ref 0.0–1.0)
WBC, UA: NONE SEEN (ref 0–?)

## 2016-11-08 LAB — HEPATIC FUNCTION PANEL
ALT: 14 U/L (ref 0–53)
AST: 16 U/L (ref 0–37)
Albumin: 4.6 g/dL (ref 3.5–5.2)
Alkaline Phosphatase: 42 U/L (ref 39–117)
Bilirubin, Direct: 0.1 mg/dL (ref 0.0–0.3)
TOTAL PROTEIN: 7.2 g/dL (ref 6.0–8.3)
Total Bilirubin: 0.8 mg/dL (ref 0.2–1.2)

## 2016-11-08 LAB — CBC WITH DIFFERENTIAL/PLATELET
Basophils Absolute: 0.1 10*3/uL (ref 0.0–0.1)
Basophils Relative: 1.1 % (ref 0.0–3.0)
EOS PCT: 5.4 % — AB (ref 0.0–5.0)
Eosinophils Absolute: 0.3 10*3/uL (ref 0.0–0.7)
HEMATOCRIT: 45.1 % (ref 39.0–52.0)
HEMOGLOBIN: 14.9 g/dL (ref 13.0–17.0)
LYMPHS PCT: 34.4 % (ref 12.0–46.0)
Lymphs Abs: 1.8 10*3/uL (ref 0.7–4.0)
MCHC: 33.1 g/dL (ref 30.0–36.0)
MCV: 87.7 fl (ref 78.0–100.0)
MONOS PCT: 7.3 % (ref 3.0–12.0)
Monocytes Absolute: 0.4 10*3/uL (ref 0.1–1.0)
NEUTROS ABS: 2.7 10*3/uL (ref 1.4–7.7)
Neutrophils Relative %: 51.8 % (ref 43.0–77.0)
Platelets: 206 10*3/uL (ref 150.0–400.0)
RBC: 5.14 Mil/uL (ref 4.22–5.81)
RDW: 13.9 % (ref 11.5–15.5)
WBC: 5.2 10*3/uL (ref 4.0–10.5)

## 2016-11-08 LAB — BASIC METABOLIC PANEL
BUN: 14 mg/dL (ref 6–23)
CHLORIDE: 103 meq/L (ref 96–112)
CO2: 28 meq/L (ref 19–32)
CREATININE: 0.96 mg/dL (ref 0.40–1.50)
Calcium: 9.8 mg/dL (ref 8.4–10.5)
GFR: 91.41 mL/min (ref >60.00)
GLUCOSE: 134 mg/dL — AB (ref 70–99)
POTASSIUM: 4.7 meq/L (ref 3.5–5.1)
Sodium: 140 mEq/L (ref 135–145)

## 2016-11-08 LAB — LDL CHOLESTEROL, DIRECT: LDL DIRECT: 137 mg/dL

## 2016-11-08 LAB — LIPID PANEL
Cholesterol: 263 mg/dL — ABNORMAL HIGH (ref 0–200)
HDL: 38.5 mg/dL — ABNORMAL LOW (ref >39.00)
NonHDL: 224.17
Total CHOL/HDL Ratio: 7
Triglycerides: 315 mg/dL — ABNORMAL HIGH (ref 0.0–149.0)
VLDL: 63 mg/dL — ABNORMAL HIGH (ref 0.0–40.0)

## 2016-11-08 LAB — PSA: PSA: 0.24 ng/mL (ref 0.10–4.00)

## 2016-11-08 LAB — VITAMIN D 25 HYDROXY (VIT D DEFICIENCY, FRACTURES): VITD: 18.75 ng/mL — ABNORMAL LOW (ref 30.00–100.00)

## 2016-11-08 LAB — HEMOGLOBIN A1C: HEMOGLOBIN A1C: 6.9 % — AB (ref 4.6–6.5)

## 2016-11-08 LAB — TSH: TSH: 1.43 u[IU]/mL (ref 0.35–4.50)

## 2016-11-08 NOTE — Telephone Encounter (Signed)
Pt's wife has been informed and expressed understanding. 

## 2016-11-08 NOTE — Telephone Encounter (Signed)
-----   Message from Biagio Borg, MD sent at 11/08/2016  1:45 PM EDT ----- [name] for Jonathan Watkins to contact pt as his next appt is in November  Labs are OK except for low Vit D; please start OTC Vit D 2000 units per day  Other labs can be d/w pt at next visit

## 2016-11-22 DIAGNOSIS — Z23 Encounter for immunization: Secondary | ICD-10-CM | POA: Diagnosis not present

## 2017-01-10 ENCOUNTER — Encounter: Payer: Self-pay | Admitting: Internal Medicine

## 2017-01-10 ENCOUNTER — Ambulatory Visit (INDEPENDENT_AMBULATORY_CARE_PROVIDER_SITE_OTHER): Payer: 59 | Admitting: Internal Medicine

## 2017-01-10 VITALS — BP 124/88 | HR 87 | Temp 98.8°F | Ht 69.0 in | Wt 215.0 lb

## 2017-01-10 DIAGNOSIS — E119 Type 2 diabetes mellitus without complications: Secondary | ICD-10-CM | POA: Diagnosis not present

## 2017-01-10 DIAGNOSIS — Z0000 Encounter for general adult medical examination without abnormal findings: Secondary | ICD-10-CM

## 2017-01-10 DIAGNOSIS — Z23 Encounter for immunization: Secondary | ICD-10-CM

## 2017-01-10 DIAGNOSIS — E559 Vitamin D deficiency, unspecified: Secondary | ICD-10-CM | POA: Diagnosis not present

## 2017-01-10 MED ORDER — VITAMIN D (ERGOCALCIFEROL) 1.25 MG (50000 UNIT) PO CAPS: 50000.0000 [IU] | ORAL_CAPSULE | ORAL | 0 refills | Status: DC

## 2017-01-10 NOTE — Progress Notes (Signed)
Subjective:    Patient ID: Jonathan Watkins, male    DOB: 02-04-1976, 41 y.o.   MRN: [id number]  HPI  Here for wellness and f/u;  Overall doing ok;  Pt denies Chest pain, worsening SOB, DOE, wheezing, orthopnea, PND, worsening LE edema, palpitations, dizziness or syncope.  Pt denies neurological change such as new headache, facial or extremity weakness.  Pt denies polydipsia, polyuria, or low sugar symptoms. Pt states overall good compliance with treatment and medications, good tolerability, and has been trying to follow appropriate diet.  Pt denies worsening depressive symptoms, suicidal ideation or panic. No fever, night sweats, wt loss, loss of appetite, or other constitutional symptoms.  Pt states good ability with ADL's, has low fall risk, home safety reviewed and adequate, no other significant changes in hearing or vision, and only occasionally active with exercise. No new complaints or interval hx Past Medical History:  Diagnosis Date  . Abdominal pain, left lower quadrant 10/29/2008  . ALLERGIC RHINITIS 10/29/2008  . Diabetes mellitus without complication ([place])   . ELEVATED BLOOD PRESSURE WITHOUT DIAGNOSIS OF HYPERTENSION 10/22/2007  . ERECTILE DYSFUNCTION 01/26/2008  . GERD 10/22/2007  . Headache(784.0) 05/02/2009  . HEMATOCHEZIA 11/30/2008  . HYPERLIPIDEMIA 03/21/2007  . Impaired glucose tolerance 03/21/2011  . NECK PAIN 12/15/2009  . Other dysphagia 05/02/2009  . Personal history of colonic polyps 12/28/2008  . Renal stones 03/21/2011  . SEIZURE DISORDER 03/21/2007   Past Surgical History:  Procedure Laterality Date  . ANAL FISSURE REPAIR    . nasal surgury      reports that  has never smoked. he has never used smokeless tobacco. He reports that he drinks alcohol. He reports that he does not use drugs. family history is not on file. No Known Allergies Current Outpatient Medications on File Prior to Visit  Medication Sig Dispense Refill  . ibuprofen (ADVIL,MOTRIN) 200 MG tablet Take 200 mg by  mouth every 6 (six) hours as needed for moderate pain.    Marland Kitchen losartan (COZAAR) 25 MG tablet Take 1 tablet (25 mg total) by mouth daily. 90 tablet 3  . metFORMIN (GLUCOPHAGE-XR) 500 MG 24 hr tablet Take 2 tablets (1,000 mg total) by mouth daily with breakfast. 180 tablet 3  . ondansetron (ZOFRAN) 4 MG tablet Take 1 tablet (4 mg total) by mouth every 8 (eight) hours as needed for nausea or vomiting. 6 tablet 0  . oxyCODONE-acetaminophen (PERCOCET) 5-325 MG tablet Take 1-2 tablets by mouth every 4 (four) hours as needed for severe pain. 16 tablet 0  . pantoprazole (PROTONIX) 20 MG tablet TAKE 1 TABLET (20 MG TOTAL) BY MOUTH 2 (TWO) TIMES DAILY. 60 tablet 5  . ranitidine (ZANTAC) 150 MG capsule Take 150 mg by mouth daily as needed for heartburn.    . tamsulosin (FLOMAX) 0.4 MG CAPS capsule Take 1 capsule (0.4 mg total) by mouth daily. 14 capsule 0  . tadalafil (CIALIS) 20 MG tablet Take 1 tablet (20 mg total) by mouth daily as needed for erectile dysfunction. 10 tablet 11   No current facility-administered medications on file prior to visit.    Review of Systems Constitutional: Negative for other unusual diaphoresis, sweats, appetite or weight changes HENT: Negative for other worsening hearing loss, ear pain, facial swelling, mouth sores or neck stiffness.   Eyes: Negative for other worsening pain, redness or other visual disturbance.  Respiratory: Negative for other stridor or swelling Cardiovascular: Negative for other palpitations or other chest pain  Gastrointestinal: Negative for worsening diarrhea or  loose stools, blood in stool, distention or other pain Genitourinary: Negative for hematuria, flank pain or other change in urine volume.  Musculoskeletal: Negative for myalgias or other joint swelling.  Skin: Negative for other color change, or other wound or worsening drainage.  Neurological: Negative for other syncope or numbness. Hematological: Negative for other adenopathy or  swelling Psychiatric/Behavioral: Negative for hallucinations, other worsening agitation, SI, self-injury, or new decreased concentration All other system neg per pt    Objective:   Physical Exam BP 124/88   Pulse 87   Temp 98.8 F (37.1 C) (Oral)   Ht 5\' 9"  (1.753 m)   Wt 215 lb (97.5 kg)   SpO2 99%   BMI 31.75 kg/m  VS noted,  Constitutional: Pt is oriented to person, place, and time. Appears well-developed and well-nourished, in no significant distress and comfortable Head: Normocephalic and atraumatic  Eyes: Conjunctivae and EOM are normal. Pupils are equal, round, and reactive to light Right Ear: External ear normal without discharge Left Ear: External ear normal without discharge Nose: Nose without discharge or deformity Mouth/Throat: Oropharynx is without other ulcerations and moist  Neck: Normal range of motion. Neck supple. No JVD present. No tracheal deviation present or significant neck LA or mass Cardiovascular: Normal rate, regular rhythm, normal heart sounds and intact distal pulses.   Pulmonary/Chest: WOB normal and breath sounds without rales or wheezing  Abdominal: Soft. Bowel sounds are normal. NT. No HSM  Musculoskeletal: Normal range of motion. Exhibits no edema Lymphadenopathy: Has no other cervical adenopathy.  Neurological: Pt is alert and oriented to person, place, and time. Pt has normal reflexes. No cranial nerve deficit. Motor grossly intact, Gait intact Skin: Skin is warm and dry. No rash noted or new ulcerations Psychiatric:  Has normal mood and affect. Behavior is normal without agitation No other exam findings       Assessment & Plan:

## 2017-01-10 NOTE — Patient Instructions (Addendum)
You had the Pneumovax pneumonia shot today  Please take all new medication as prescribed  - the high dose Vitamin D for 12 wks, then restart the 200 units per day  Please continue all other medications as before, and refills have been done if requested.  Please have the pharmacy call with any other refills you may need.  Please continue your efforts at being more active, low cholesterol diet, and weight control.  You are otherwise up to date with prevention measures today.  Please keep your appointments with your specialists as you may have planned  Please return in 6 months, or sooner if needed, with Lab testing done 3-5 days before

## 2017-01-12 DIAGNOSIS — E559 Vitamin D deficiency, unspecified: Secondary | ICD-10-CM | POA: Insufficient documentation

## 2017-01-12 NOTE — Assessment & Plan Note (Signed)
stable overall by history and exam, recent data reviewed with pt, and pt to continue medical treatment as before,  to f/u any worsening symptoms or concerns  

## 2017-01-12 NOTE — Assessment & Plan Note (Signed)

## 2017-01-12 NOTE — Assessment & Plan Note (Signed)
For vit d replacement 

## 2017-02-28 DIAGNOSIS — Z23 Encounter for immunization: Secondary | ICD-10-CM | POA: Diagnosis not present

## 2017-04-23 DIAGNOSIS — Z630 Problems in relationship with spouse or partner: Secondary | ICD-10-CM | POA: Diagnosis not present

## 2017-05-02 DIAGNOSIS — Z630 Problems in relationship with spouse or partner: Secondary | ICD-10-CM | POA: Diagnosis not present

## 2017-05-14 DIAGNOSIS — Z630 Problems in relationship with spouse or partner: Secondary | ICD-10-CM | POA: Diagnosis not present

## 2017-05-21 ENCOUNTER — Encounter: Payer: Self-pay | Admitting: Internal Medicine

## 2017-05-21 ENCOUNTER — Ambulatory Visit (INDEPENDENT_AMBULATORY_CARE_PROVIDER_SITE_OTHER)
Admission: RE | Admit: 2017-05-21 | Discharge: 2017-05-21 | Disposition: A | Discharge status: Home / Self Care | Payer: 59 | Source: Ambulatory Visit | Attending: Internal Medicine | Admitting: Internal Medicine

## 2017-05-21 ENCOUNTER — Other Ambulatory Visit: Payer: Self-pay | Admitting: Internal Medicine

## 2017-05-21 ENCOUNTER — Other Ambulatory Visit (INDEPENDENT_AMBULATORY_CARE_PROVIDER_SITE_OTHER): Payer: 59

## 2017-05-21 ENCOUNTER — Ambulatory Visit: Payer: 59 | Admitting: Internal Medicine

## 2017-05-21 VITALS — BP 118/82 | HR 96 | Temp 98.7°F | Ht 69.0 in | Wt 206.0 lb

## 2017-05-21 DIAGNOSIS — Z202 Contact with and (suspected) exposure to infections with a predominantly sexual mode of transmission: Secondary | ICD-10-CM

## 2017-05-21 DIAGNOSIS — M545 Low back pain, unspecified: Secondary | ICD-10-CM | POA: Insufficient documentation

## 2017-05-21 DIAGNOSIS — L0591 Pilonidal cyst without abscess: Secondary | ICD-10-CM

## 2017-05-21 DIAGNOSIS — E119 Type 2 diabetes mellitus without complications: Secondary | ICD-10-CM | POA: Diagnosis not present

## 2017-05-21 DIAGNOSIS — M533 Sacrococcygeal disorders, not elsewhere classified: Secondary | ICD-10-CM

## 2017-05-21 DIAGNOSIS — M7701 Medial epicondylitis, right elbow: Secondary | ICD-10-CM | POA: Insufficient documentation

## 2017-05-21 DIAGNOSIS — N529 Male erectile dysfunction, unspecified: Secondary | ICD-10-CM | POA: Diagnosis not present

## 2017-05-21 LAB — BASIC METABOLIC PANEL
BUN: 10 mg/dL (ref 6–23)
CO2: 30 mEq/L (ref 19–32)
Calcium: 9.9 mg/dL (ref 8.4–10.5)
Chloride: 100 mEq/L (ref 96–112)
Creatinine, Ser: 0.94 mg/dL (ref 0.40–1.50)
GFR: 93.42 mL/min (ref >60.00)
Glucose, Bld: 105 mg/dL — ABNORMAL HIGH (ref 70–99)
POTASSIUM: 3.9 meq/L (ref 3.5–5.1)
SODIUM: 136 meq/L (ref 135–145)

## 2017-05-21 LAB — HEMOGLOBIN A1C: HEMOGLOBIN A1C: 6.8 % — AB (ref 4.6–6.5)

## 2017-05-21 LAB — HEPATIC FUNCTION PANEL
ALBUMIN: 4.8 g/dL (ref 3.5–5.2)
ALT: 12 U/L (ref 0–53)
AST: 12 U/L (ref 0–37)
Alkaline Phosphatase: 48 U/L (ref 39–117)
Bilirubin, Direct: 0.1 mg/dL (ref 0.0–0.3)
TOTAL PROTEIN: 8.2 g/dL (ref 6.0–8.3)
Total Bilirubin: 0.7 mg/dL (ref 0.2–1.2)

## 2017-05-21 LAB — LDL CHOLESTEROL, DIRECT: LDL DIRECT: 130 mg/dL

## 2017-05-21 LAB — LIPID PANEL
Cholesterol: 228 mg/dL — ABNORMAL HIGH (ref 0–200)
HDL: 41.7 mg/dL (ref >39.00)
NonHDL: 186.42
Total CHOL/HDL Ratio: 5
Triglycerides: 235 mg/dL — ABNORMAL HIGH (ref 0.0–149.0)
VLDL: 47 mg/dL — ABNORMAL HIGH (ref 0.0–40.0)

## 2017-05-21 MED ORDER — ROSUVASTATIN CALCIUM 20 MG PO TABS: 20.0000 mg | ORAL_TABLET | Freq: Every day | ORAL | 3 refills | Status: DC

## 2017-05-21 MED ORDER — SILDENAFIL CITRATE 100 MG PO TABS: 50.0000 mg | ORAL_TABLET | Freq: Every day | ORAL | 11 refills | Status: DC | PRN

## 2017-05-21 MED ORDER — MELOXICAM 15 MG PO TABS: 15.0000 mg | ORAL_TABLET | Freq: Every day | ORAL | 0 refills | Status: DC

## 2017-05-21 NOTE — Assessment & Plan Note (Signed)
No obvious infection but has coccyx pain very close, is afeb and nontoxic, will hold antibx for now but refer General Surgury

## 2017-05-21 NOTE — Assessment & Plan Note (Addendum)
Etiology unclear, for ls spine and coccyx films,  to f/u any worsening symptoms or concerns  Note:  Total time for pt hx, exam, review of record with pt in the room, determination of diagnoses and plan for further eval and tx is > 40 min, with over 50% spent in coordination and counseling of patient including the differential dx, tx, further evaluation and other management of tail bone pain, pilonidal cyst, right medial epicondylitis, STD exposure and ED

## 2017-05-21 NOTE — Assessment & Plan Note (Signed)
[place] for STD labs as ordered

## 2017-05-21 NOTE — Assessment & Plan Note (Signed)
Mild to mod, for nsaid prn, f/ju sport medicine in 3 wks unless improved

## 2017-05-21 NOTE — Progress Notes (Addendum)
Subjective:    Patient ID: Jonathan Watkins, male    DOB: Oct 02, 1975, 42 y.o.   MRN: [id number]  HPI  Here with multiple concerns; first has c/o subacute onset tailbone pain mild to mod constant aching type without bowel or bladder change, fever, wt loss,  worsening LE pain/numbness/weakness, gait change or falls.  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  No recent trauma, falls and does not feel he has been sitting more than usual for him.   Also c/o right inner elbow pain without redness or swelling, mild, intermittent, worse to pick up even 15 lbs, better to rest, and no neck or other arm pain, numbness or weakness.   Also has worsening ED symptoms in past year, asks for viagra hardcopy to shop around. Also states he has been in d/w wife and admitted to her he has had an affair; they are not separated but trying to work things out; pt states she will work on trust issues after he has STD check.  Denies urinary symptoms such as dysuria, frequency, urgency, flank pain, hematuria or n/v, fever, chills, and denies penile d/c, rash or genital ulcer Past Medical History:  Diagnosis Date  . Abdominal pain, left lower quadrant 10/29/2008  . ALLERGIC RHINITIS 10/29/2008  . Diabetes mellitus without complication ([place])   . ELEVATED BLOOD PRESSURE WITHOUT DIAGNOSIS OF HYPERTENSION 10/22/2007  . ERECTILE DYSFUNCTION 01/26/2008  . GERD 10/22/2007  . Headache(784.0) 05/02/2009  . HEMATOCHEZIA 11/30/2008  . HYPERLIPIDEMIA 03/21/2007  . Impaired glucose tolerance 03/21/2011  . NECK PAIN 12/15/2009  . Other dysphagia 05/02/2009  . Personal history of colonic polyps 12/28/2008  . Renal stones 03/21/2011  . SEIZURE DISORDER 03/21/2007   Past Surgical History:  Procedure Laterality Date  . ANAL FISSURE REPAIR    . nasal surgury      reports that he has never smoked. He has never used smokeless tobacco. He reports that  he drinks alcohol. He reports that he does not use drugs. family history is not on file. No Known Allergies Current Outpatient Medications on File Prior to Visit  Medication Sig Dispense Refill  . ibuprofen (ADVIL,MOTRIN) 200 MG tablet Take 200 mg by mouth every 6 (six) hours as needed for moderate pain.    Marland Kitchen losartan (COZAAR) 25 MG tablet Take 1 tablet (25 mg total) by mouth daily. 90 tablet 3  . metFORMIN (GLUCOPHAGE-XR) 500 MG 24 hr tablet Take 2 tablets (1,000 mg total) by mouth daily with breakfast. 180 tablet 3  . ondansetron (ZOFRAN) 4 MG tablet Take 1 tablet (4 mg total) by mouth every 8 (eight) hours as needed for nausea or vomiting. 6 tablet 0  . oxyCODONE-acetaminophen (PERCOCET) 5-325 MG tablet Take 1-2 tablets by mouth every 4 (four) hours as needed for severe pain. 16 tablet 0  . pantoprazole (PROTONIX) 20 MG tablet TAKE 1 TABLET (20 MG TOTAL) BY MOUTH 2 (TWO) TIMES DAILY. 60 tablet 5  . ranitidine (ZANTAC) 150 MG capsule Take 150 mg by mouth daily as needed for heartburn.    . tamsulosin (FLOMAX) 0.4 MG CAPS capsule Take 1 capsule (0.4 mg total) by mouth daily. 14 capsule 0  . Vitamin D, Ergocalciferol, (DRISDOL) 50000 units CAPS capsule Take 1 capsule (50,000 Units total) by mouth every 7 (seven) days. 12 capsule 0  . tadalafil (CIALIS) 20 MG tablet Take 1 tablet (20 mg total)  by mouth daily as needed for erectile dysfunction. 10 tablet 11   No current facility-administered medications on file prior to visit.    Review of Systems  Constitutional: Negative for other unusual diaphoresis or sweats HENT: Negative for ear discharge or swelling Eyes: Negative for other worsening visual disturbances Respiratory: Negative for stridor or other swelling  Gastrointestinal: Negative for worsening distension or other blood Genitourinary: Negative for retention or other urinary change Musculoskeletal: Negative for other MSK pain or swelling Skin: Negative for color change or other new  lesions Neurological: Negative for worsening tremors and other numbness  Psychiatric/Behavioral: Negative for worsening agitation or other fatigue\All other system neg per pt    Objective:   Physical Exam BP 118/82   Pulse 96   Temp 98.7 F (37.1 C) (Oral)   Ht 5\' 9"  (1.753 m)   Wt 206 lb (93.4 kg)   SpO2 97%   BMI 30.42 kg/m  VS noted,  Constitutional: Pt appears in NAD HENT: Head: NCAT.  Right Ear: External ear normal.  Left Ear: External ear normal.  Eyes: . Pupils are equal, round, and reactive to light. Conjunctivae and EOM are normal Nose: without d/c or deformity Neck: Neck supple. Gross normal ROM Cardiovascular: Normal rate and regular rhythm.   Pulmonary/Chest: Effort normal and breath sounds without rales or wheezing.  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 At baseline orientation, motor grossly intact Skin: Skin is warm. No rashes, other new lesions, no LE edema Psychiatric: Pt behavior is normal without agitation  No other exam findings    Assessment & Plan:

## 2017-05-21 NOTE — Patient Instructions (Signed)
Please take all new medication as prescribed  - the antiinflammatory  Please make appt with Dr Tamala Julian for 3 wks, which you can cancel if needed  Please take all new medication as prescribed - the hardcopy of viagra  You will be contacted regarding the referral for: General Surgury  Please continue all other medications as before, and refills have been done if requested.  Please have the pharmacy call with any other refills you may need.  Please keep your appointments with your specialists as you may have planned  Please go to the XRAY Department in the Basement (go straight as you get off the elevator) for the x-ray testing  Please go to the LAB in the Basement (turn left off the elevator) for the tests to be done today  You will be contacted by phone if any changes need to be made immediately.  Otherwise, you will receive a letter about your results with an explanation, but please check with MyChart first.  Please remember to sign up for MyChart if you have not done so, as this will be important to you in the future with finding out test results, communicating by private email, and scheduling acute appointments online when needed.

## 2017-05-21 NOTE — Assessment & Plan Note (Signed)
Ok for viagra prn,  to f/u any worsening symptoms or concerns  

## 2017-05-22 ENCOUNTER — Encounter: Payer: Self-pay | Admitting: Internal Medicine

## 2017-05-22 ENCOUNTER — Telehealth: Payer: Self-pay

## 2017-05-22 LAB — HSV 2 ANTIBODY, IGG

## 2017-05-22 LAB — HIV ANTIBODY (ROUTINE TESTING W REFLEX): HIV 1&2 Ab, 4th Generation: NONREACTIVE

## 2017-05-22 LAB — RPR: RPR: NONREACTIVE

## 2017-05-22 NOTE — Telephone Encounter (Signed)
Called pt, no VM set up to receive msgs.

## 2017-05-22 NOTE — Telephone Encounter (Signed)
-----   Message from Biagio Borg, MD sent at 05/21/2017  7:50 PM EDT ----- Left message on MyChart, pt to cont same tx except  The test results show that your current treatment is OK, except the LDL cholesterol is moderately high, since the goal for you would be to be less than 70.  Please start generic crestor 20 mg daily, to help reduce your future risk of heart disease and stroke    Tashiana Lamarca to please inform pt, I will do rx

## 2017-05-23 LAB — GC/CHLAMYDIA PROBE AMP
CHLAMYDIA, DNA PROBE: NEGATIVE
Neisseria gonorrhoeae by PCR: NEGATIVE

## 2017-06-06 ENCOUNTER — Ambulatory Visit: Payer: 59 | Admitting: Family Medicine

## 2017-06-06 ENCOUNTER — Encounter: Payer: Self-pay | Admitting: Family Medicine

## 2017-06-06 VITALS — BP 128/68 | HR 88 | Temp 98.6°F | Ht 69.0 in | Wt 212.0 lb

## 2017-06-06 DIAGNOSIS — M25521 Pain in right elbow: Secondary | ICD-10-CM | POA: Diagnosis not present

## 2017-06-06 MED ORDER — DICLOFENAC SODIUM 2 % TD SOLN: 1.0000 "application " | Freq: Two times a day (BID) | TRANSDERMAL | 3 refills | Status: DC

## 2017-06-06 NOTE — Assessment & Plan Note (Signed)
Pain is generalized around his elbow. No specific point tenderness on the ME and LE. No swelling or ecchymosis. Possible for strain with returning to lifting and lifted heavy - counseled on returning to lifting  - counseled on ice on compression  - advised to take the mobic and then can transition to pennsaid  - if no improvement would consider PT

## 2017-06-06 NOTE — Progress Notes (Signed)
Jonathan Watkins - 42 y.o. male MRN [id number]  Date of birth: Jan 04, 1976  SUBJECTIVE:  Including CC & ROS.  Chief Complaint  Patient presents with  . Right Elbow pain    Jonathan Watkins is a 42 y.o. male that is presenting with right elbow pain. Ongoing for one month. Pain is generalized around the anterior elbow. He was performing biceps curls and felt the pain after.  The pain is intermittent, described as a shooting pain.  He noticed the pain after he was working out. Certain movements trigger the pain. Denies injury. Denies tingling or numbness. Picking up heavy things causes pain. The pain doesn't radiate. Has not started the mobic. Has tried icing and hasn't lifted since there has been pain.     Review of Systems  Constitutional: Negative for fever.  HENT: Negative for congestion.   Respiratory: Negative for cough.   Cardiovascular: Negative for chest pain.  Gastrointestinal: Negative for abdominal distention.  Musculoskeletal: Negative for joint swelling.  Skin: Negative for color change.  Neurological: Negative for weakness.  Hematological: Negative for adenopathy.  Psychiatric/Behavioral: Negative for agitation.    HISTORY: Past Medical, Surgical, Social, and Family History Reviewed & Updated per EMR.   Pertinent Historical Findings include:  Past Medical History:  Diagnosis Date  . Abdominal pain, left lower quadrant 10/29/2008  . ALLERGIC RHINITIS 10/29/2008  . Diabetes mellitus without complication ([place])   . ELEVATED BLOOD PRESSURE WITHOUT DIAGNOSIS OF HYPERTENSION 10/22/2007  . ERECTILE DYSFUNCTION 01/26/2008  . GERD 10/22/2007  . Headache(784.0) 05/02/2009  . HEMATOCHEZIA 11/30/2008  . HYPERLIPIDEMIA 03/21/2007  . Impaired glucose tolerance 03/21/2011  . NECK PAIN 12/15/2009  . Other dysphagia 05/02/2009  . Personal history of colonic polyps 12/28/2008  . Renal stones 03/21/2011  . SEIZURE DISORDER 03/21/2007    Past Surgical History:  Procedure Laterality Date  . ANAL  FISSURE REPAIR    . nasal surgury      No Known Allergies  No family history on file.   Social History   Socioeconomic History  . Marital status: Married    Spouse name: Not on file  . Number of children: 2  . Years of education: Not on file  . Highest education level: Not on file  Occupational History  . 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[employer]  Social Needs  . Financial resource strain: Not on file  . Food insecurity:    Worry: Not on file    Inability: Not on file  . Transportation needs:    Medical: Not on file    Non-medical: Not on file  Tobacco Use  . Smoking status: Never Smoker  . Smokeless tobacco: Never Used  Substance and Sexual Activity  . Alcohol use: Yes    Comment: 1-2 per month, mixed drink or beer  . Drug use: No  . Sexual activity: Not on file  Lifestyle  . Physical activity:    Days per week: Not on file    Minutes per session: Not on file  . Stress: Not on file  Relationships  . Social connections:    Talks on phone: Not on file    Gets together: Not on file    Attends religious service: Not on file    Active member of club or organization: Not on file    Attends meetings of clubs or organizations: Not on file    Relationship status: Not on file  . Intimate partner violence:    Fear of current or ex partner: Not  on file    Emotionally abused: Not on file    Physically abused: Not on file    Forced sexual activity: Not on file  Other Topics Concern  . Not on file  Social History Narrative  . Not on file     PHYSICAL EXAM:  VS: BP 128/68 (BP Location: Left Arm, Patient Position: Sitting, Cuff Size: Normal)   Pulse 88   Temp 98.6 F (37 C) (Oral)   Ht 5\' 9"  (1.753 m)   Wt 212 lb (96.2 kg)   SpO2 98%   BMI 31.31 kg/m  Physical Exam Gen: NAD, alert, cooperative with exam, well-appearing ENT: normal lips, normal nasal mucosa,  Eye: normal EOM, normal conjunctiva and lids CV:  no edema, +2 pedal pulses   Resp: no  accessory muscle use, non-labored,  Skin: no rashes, no areas of induration  Neuro: normal tone, normal sensation to touch Psych:  normal insight, alert and oriented MSK:  Left elbow:  No TTP of the ME or LE  Normal ROM  No TTp of the common flexors or extensors  No pain with resistance with wrist flexion and extension  Pain with grip squeeze  Neurovascularly intact    Limited ultrasound: left elbow:  Normal appearing LE and ME  Normal appearing trochea  No tear or heamatoma   Summary: normal exam   Ultrasound and interpretation by Clearance Coots, MD        ASSESSMENT & PLAN:   Right elbow pain Pain is generalized around his elbow. No specific point tenderness on the ME and LE. No swelling or ecchymosis. Possible for strain with returning to lifting and lifted heavy - counseled on returning to lifting  - counseled on ice on compression  - advised to take the mobic and then can transition to pennsaid  - if no improvement would consider PT

## 2017-06-06 NOTE — Patient Instructions (Addendum)
Take the mobic for 10 days straight and then as needed.  Please try ice and compression Please try low weight for resistance training. Start slow and build yourself up.  You can try the pennsaid  Please follow up with me in 3-4 weeks if your symptoms haven't improved.

## 2017-06-13 ENCOUNTER — Ambulatory Visit: Payer: 59 | Admitting: Family Medicine

## 2017-06-24 DIAGNOSIS — L0591 Pilonidal cyst without abscess: Secondary | ICD-10-CM | POA: Diagnosis not present

## 2017-06-24 DIAGNOSIS — M533 Sacrococcygeal disorders, not elsewhere classified: Secondary | ICD-10-CM | POA: Diagnosis not present

## 2017-08-23 ENCOUNTER — Other Ambulatory Visit: Payer: Self-pay | Admitting: Internal Medicine

## 2017-10-17 ENCOUNTER — Ambulatory Visit (INDEPENDENT_AMBULATORY_CARE_PROVIDER_SITE_OTHER): Payer: 59 | Admitting: *Deleted

## 2017-10-17 DIAGNOSIS — Z23 Encounter for immunization: Secondary | ICD-10-CM

## 2017-11-20 DIAGNOSIS — M533 Sacrococcygeal disorders, not elsewhere classified: Secondary | ICD-10-CM | POA: Diagnosis not present

## 2017-11-21 ENCOUNTER — Ambulatory Visit (INDEPENDENT_AMBULATORY_CARE_PROVIDER_SITE_OTHER): Payer: 59 | Admitting: *Deleted

## 2017-11-21 DIAGNOSIS — Z23 Encounter for immunization: Secondary | ICD-10-CM

## 2017-11-25 DIAGNOSIS — M533 Sacrococcygeal disorders, not elsewhere classified: Secondary | ICD-10-CM | POA: Diagnosis not present

## 2017-11-28 DIAGNOSIS — M533 Sacrococcygeal disorders, not elsewhere classified: Secondary | ICD-10-CM | POA: Diagnosis not present

## 2017-12-02 DIAGNOSIS — M533 Sacrococcygeal disorders, not elsewhere classified: Secondary | ICD-10-CM | POA: Diagnosis not present

## 2017-12-09 DIAGNOSIS — M533 Sacrococcygeal disorders, not elsewhere classified: Secondary | ICD-10-CM | POA: Diagnosis not present

## 2017-12-13 DIAGNOSIS — M533 Sacrococcygeal disorders, not elsewhere classified: Secondary | ICD-10-CM | POA: Diagnosis not present

## 2018-01-27 DIAGNOSIS — L700 Acne vulgaris: Secondary | ICD-10-CM | POA: Diagnosis not present

## 2018-01-27 DIAGNOSIS — L304 Erythema intertrigo: Secondary | ICD-10-CM | POA: Diagnosis not present

## 2018-01-27 DIAGNOSIS — D224 Melanocytic nevi of scalp and neck: Secondary | ICD-10-CM | POA: Diagnosis not present

## 2018-04-28 ENCOUNTER — Ambulatory Visit: Payer: Self-pay

## 2018-04-28 NOTE — Telephone Encounter (Signed)
Pt returned call to patient spoke to his wife who wanted to make sure her husband had completed his Hepititis B vaccines. Call her back left VM that Hepititis B is a series of 3 vaccines and to please call office.  Answer Assessment - Initial Assessment Questions 1. SYMPTOMS: "Do you have any symptoms?"     No just question how many Hep B vaccines do you receive 2. SEVERITY: If symptoms are present, ask "Are they mild, moderate or severe?"     None  Protocols used: MEDICATION QUESTION CALL-A-AH

## 2018-04-29 NOTE — Telephone Encounter (Signed)
Patient did receive "NEW" heb b vaccine, Heplisav, which is only a 2 dose series one month apart----advised that they will need to get one more hep A vaccine to complete that series, patient states she will get at pharmacy

## 2018-04-30 DIAGNOSIS — Z23 Encounter for immunization: Secondary | ICD-10-CM | POA: Diagnosis not present

## 2018-05-01 ENCOUNTER — Ambulatory Visit: Payer: 59

## 2018-07-13 IMAGING — DX DG LUMBAR SPINE COMPLETE 4+V
5 series · 5 of 5 positions shown · non-contrast
Comparison: None.

CLINICAL DATA: Low back pain for 1 month.  No known injury.

EXAM:
LUMBAR SPINE - COMPLETE 4+ VIEW

[l-spine ap]
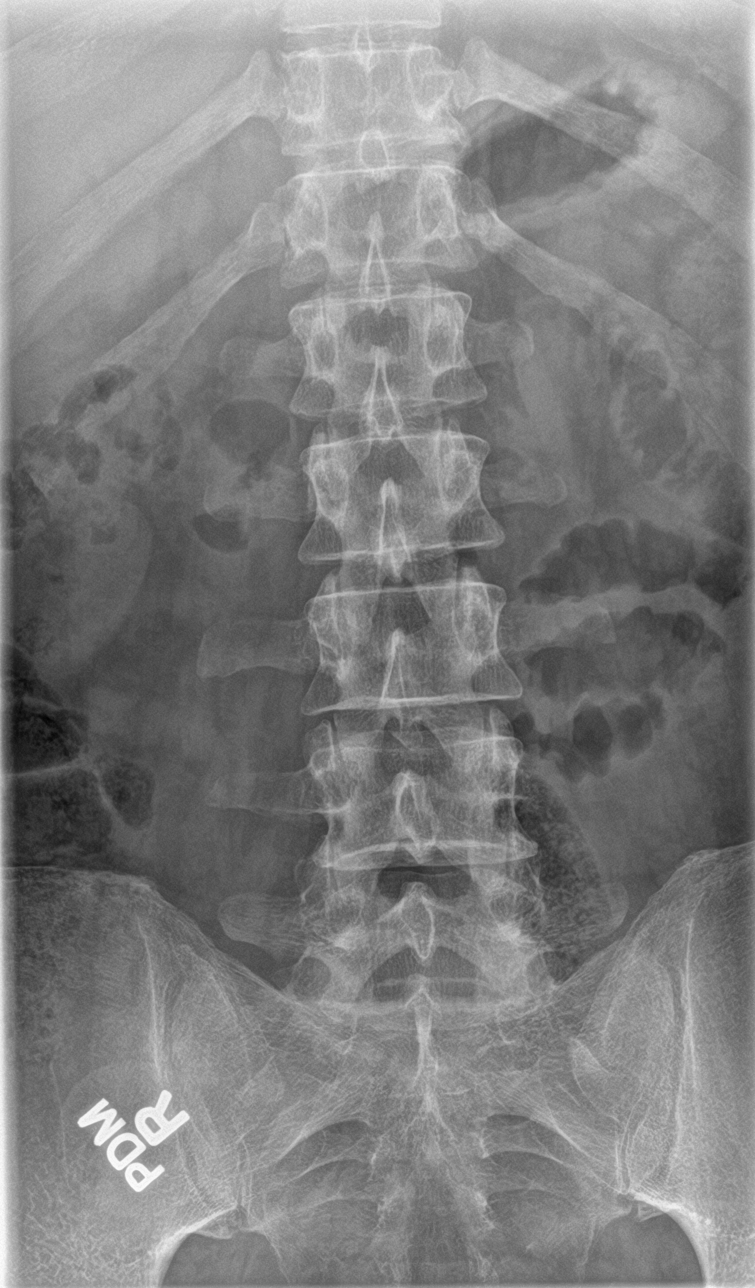

[l-spine obl (1 of 2)]
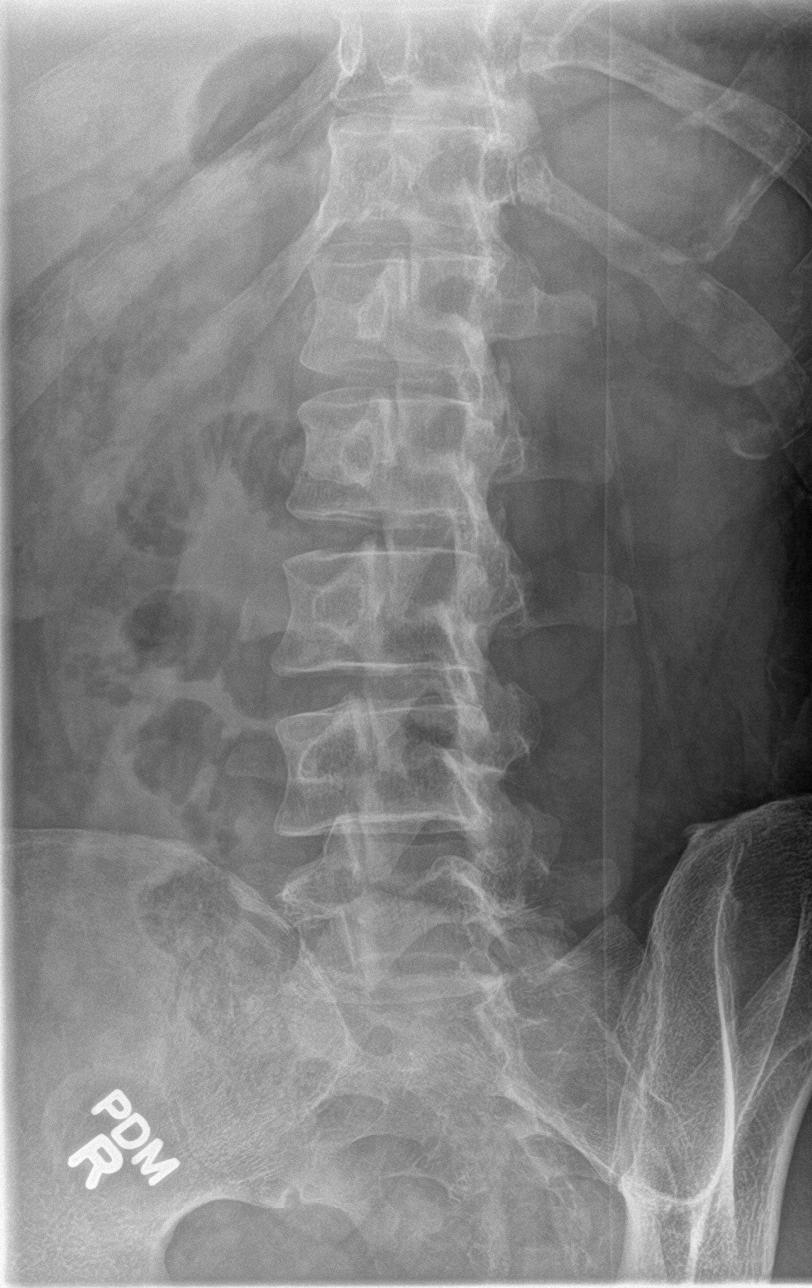

[l-spine obl (2 of 2)]
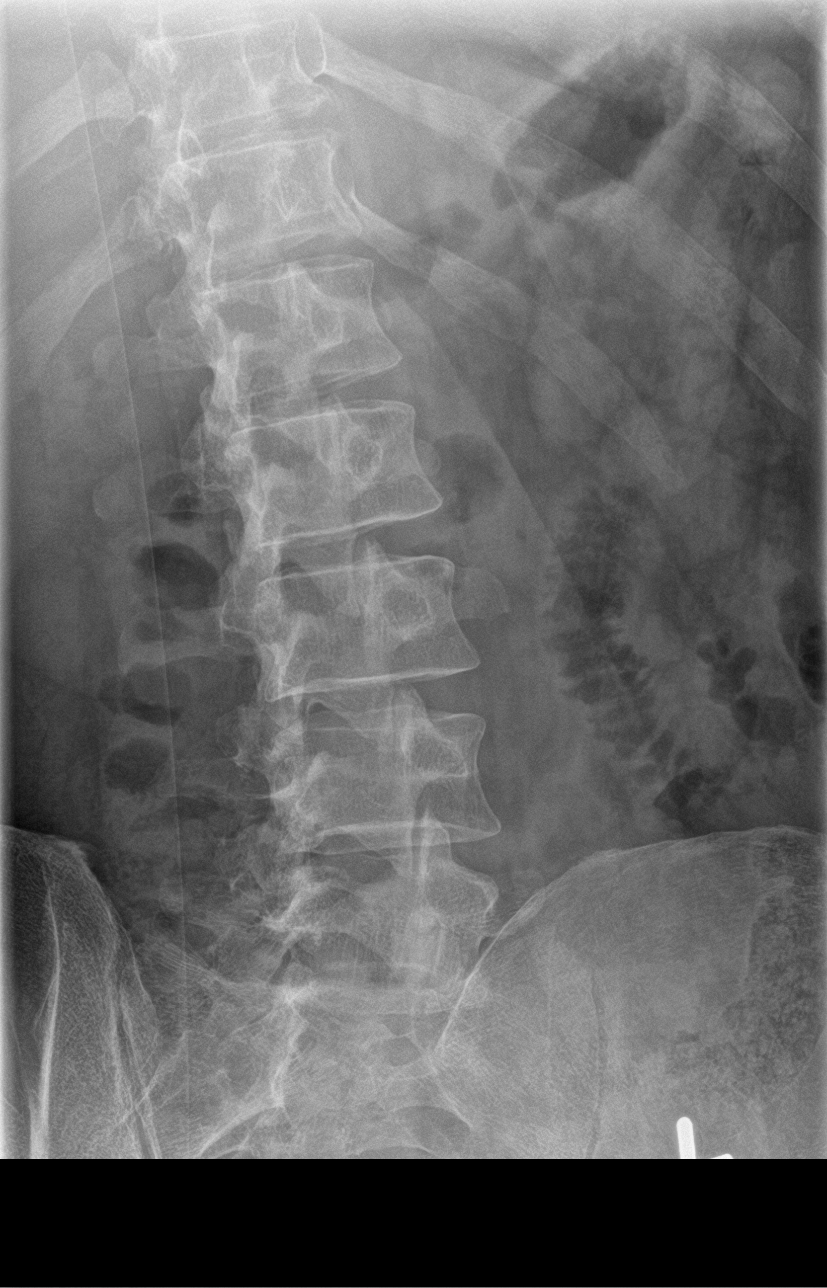

[l-spine lat]
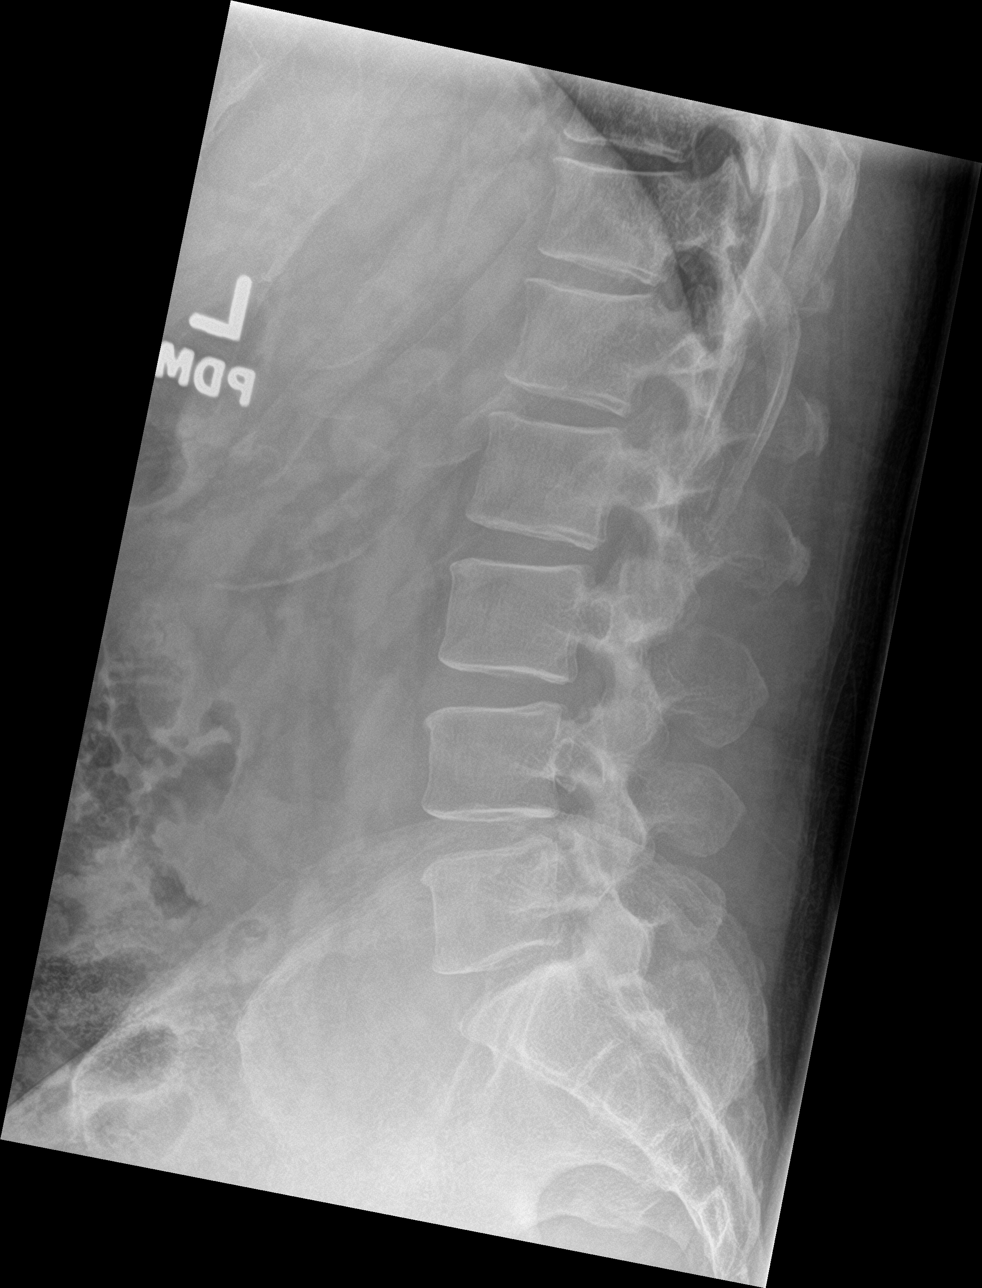

[l-spine spot]
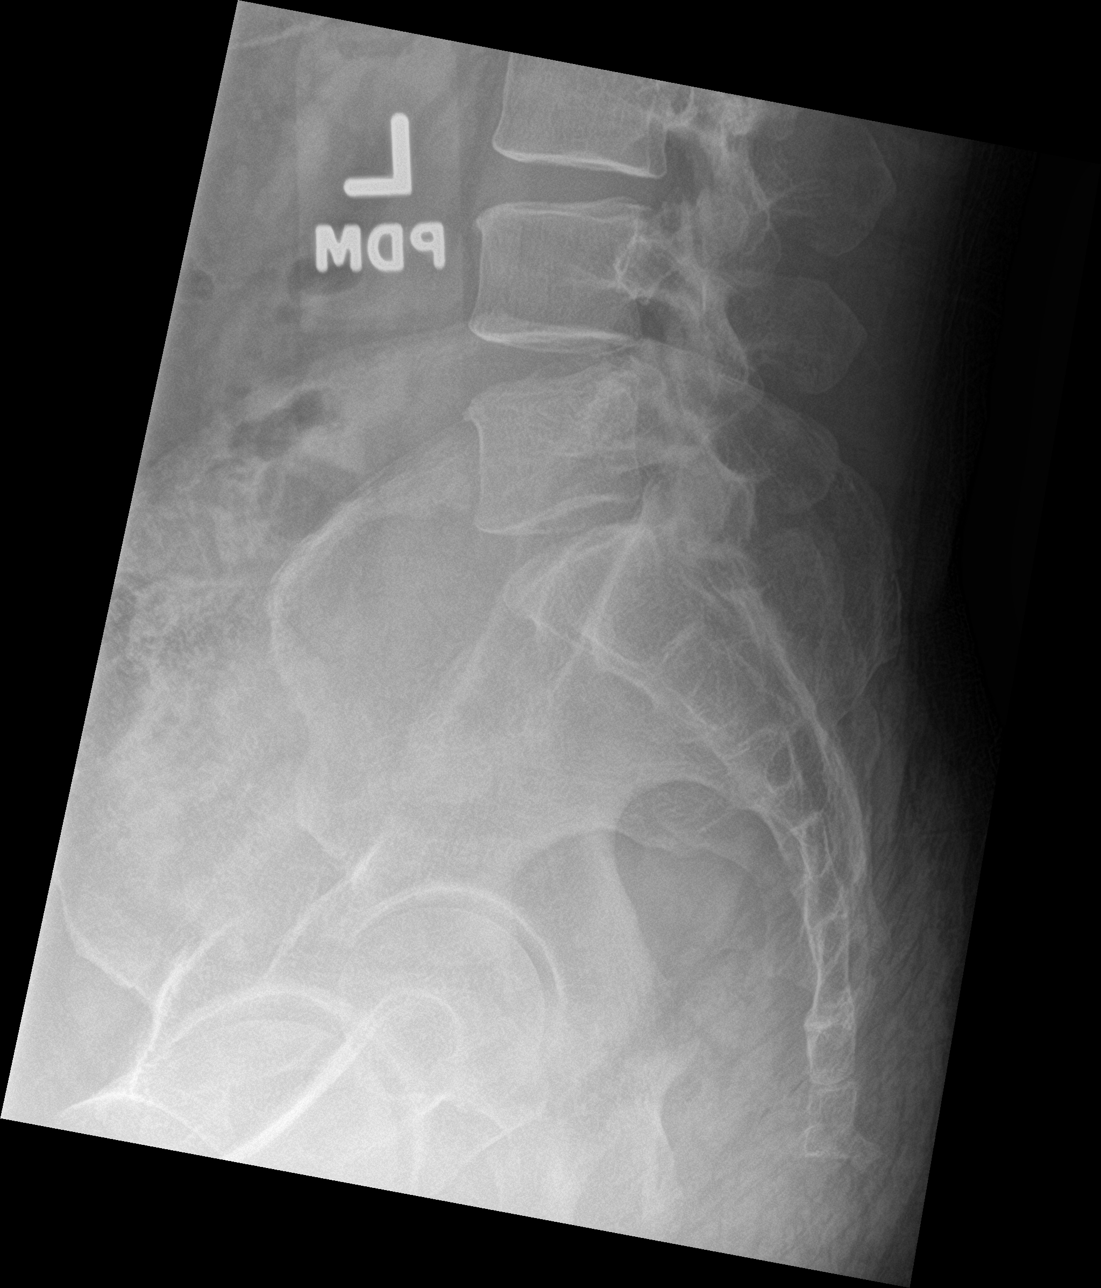

[5 of 5 positions shown; findings below may reference images not displayed]

FINDINGS: There is no evidence of lumbar spine fracture. Alignment is normal.
Intervertebral disc spaces are maintained. No focal bone lesions
identified.
IMPRESSION: Negative.

## 2018-09-15 ENCOUNTER — Telehealth: Payer: Self-pay

## 2018-09-15 NOTE — Telephone Encounter (Signed)
Called pt, LVM. Informed pt he is up to date.   Copied from [place] [id number]. Topic: Quick Communication - See Telephone Encounter >> Sep 15, 2018 10:47 AM Parke Poisson wrote: Reason for CRM: Pt's wife wants to know if he is up to date on Hepatitis A/B vaccine.If so she would like it entered into [facility] or call to advise

## 2018-09-15 NOTE — Telephone Encounter (Signed)
Added to [facility].Marland KitchenJohny Watkins

## 2018-12-14 ENCOUNTER — Telehealth: Payer: Self-pay | Admitting: Internal Medicine

## 2018-12-15 NOTE — Telephone Encounter (Signed)
Ok to contact pt -   Unable to further refill the metformin due to office med refill polic - needs ROV

## 2018-12-30 ENCOUNTER — Other Ambulatory Visit: Payer: Self-pay | Admitting: Internal Medicine

## 2019-02-25 ENCOUNTER — Other Ambulatory Visit: Payer: Self-pay

## 2019-02-25 ENCOUNTER — Ambulatory Visit (INDEPENDENT_AMBULATORY_CARE_PROVIDER_SITE_OTHER): Payer: 59 | Admitting: Internal Medicine

## 2019-02-25 ENCOUNTER — Encounter: Payer: Self-pay | Admitting: Internal Medicine

## 2019-02-25 VITALS — BP 150/92 | HR 83 | Temp 98.9°F | Ht 69.0 in | Wt 204.0 lb

## 2019-02-25 DIAGNOSIS — E538 Deficiency of other specified B group vitamins: Secondary | ICD-10-CM | POA: Diagnosis not present

## 2019-02-25 DIAGNOSIS — E785 Hyperlipidemia, unspecified: Secondary | ICD-10-CM

## 2019-02-25 DIAGNOSIS — E119 Type 2 diabetes mellitus without complications: Secondary | ICD-10-CM | POA: Diagnosis not present

## 2019-02-25 DIAGNOSIS — I10 Essential (primary) hypertension: Secondary | ICD-10-CM

## 2019-02-25 DIAGNOSIS — E559 Vitamin D deficiency, unspecified: Secondary | ICD-10-CM | POA: Diagnosis not present

## 2019-02-25 DIAGNOSIS — Z0000 Encounter for general adult medical examination without abnormal findings: Secondary | ICD-10-CM

## 2019-02-25 DIAGNOSIS — Z125 Encounter for screening for malignant neoplasm of prostate: Secondary | ICD-10-CM

## 2019-02-25 DIAGNOSIS — E611 Iron deficiency: Secondary | ICD-10-CM | POA: Diagnosis not present

## 2019-02-25 LAB — IBC PANEL
Iron: 82 ug/dL (ref 42–165)
Saturation Ratios: 18.2 % — ABNORMAL LOW (ref 20.0–50.0)
Transferrin: 321 mg/dL (ref 212.0–360.0)

## 2019-02-25 LAB — CBC WITH DIFFERENTIAL/PLATELET
Basophils Absolute: 0 10*3/uL (ref 0.0–0.1)
Basophils Relative: 0.9 % (ref 0.0–3.0)
Eosinophils Absolute: 0.1 10*3/uL (ref 0.0–0.7)
Eosinophils Relative: 2.2 % (ref 0.0–5.0)
HCT: 46.3 % (ref 39.0–52.0)
Hemoglobin: 15.3 g/dL (ref 13.0–17.0)
Lymphocytes Relative: 34.3 % (ref 12.0–46.0)
Lymphs Abs: 1.7 10*3/uL (ref 0.7–4.0)
MCHC: 33.1 g/dL (ref 30.0–36.0)
MCV: 87.7 fl (ref 78.0–100.0)
Monocytes Absolute: 0.3 10*3/uL (ref 0.1–1.0)
Monocytes Relative: 6.6 % (ref 3.0–12.0)
Neutro Abs: 2.7 10*3/uL (ref 1.4–7.7)
Neutrophils Relative %: 56 % (ref 43.0–77.0)
Platelets: 227 10*3/uL (ref 150.0–400.0)
RBC: 5.28 Mil/uL (ref 4.22–5.81)
RDW: 14.5 % (ref 11.5–15.5)
WBC: 4.9 10*3/uL (ref 4.0–10.5)

## 2019-02-25 LAB — LIPID PANEL
Cholesterol: 229 mg/dL — ABNORMAL HIGH (ref 0–200)
HDL: 42 mg/dL (ref >39.00)
NonHDL: 187.16
Total CHOL/HDL Ratio: 5
Triglycerides: 221 mg/dL — ABNORMAL HIGH (ref 0.0–149.0)
VLDL: 44.2 mg/dL — ABNORMAL HIGH (ref 0.0–40.0)

## 2019-02-25 LAB — MICROALBUMIN / CREATININE URINE RATIO
Creatinine,U: 170.2 mg/dL
Microalb Creat Ratio: 1.4 mg/g (ref 0.0–30.0)
Microalb, Ur: 2.4 mg/dL — ABNORMAL HIGH (ref 0.0–1.9)

## 2019-02-25 LAB — BASIC METABOLIC PANEL
BUN: 11 mg/dL (ref 6–23)
CO2: 28 mEq/L (ref 19–32)
Calcium: 9.7 mg/dL (ref 8.4–10.5)
Chloride: 101 mEq/L (ref 96–112)
Creatinine, Ser: 0.85 mg/dL (ref 0.40–1.50)
GFR: 97.91 mL/min (ref >60.00)
Glucose, Bld: 134 mg/dL — ABNORMAL HIGH (ref 70–99)
Potassium: 3.9 mEq/L (ref 3.5–5.1)
Sodium: 137 mEq/L (ref 135–145)

## 2019-02-25 LAB — HEPATIC FUNCTION PANEL
ALT: 16 U/L (ref 0–53)
AST: 15 U/L (ref 0–37)
Albumin: 4.8 g/dL (ref 3.5–5.2)
Alkaline Phosphatase: 46 U/L (ref 39–117)
Bilirubin, Direct: 0.1 mg/dL (ref 0.0–0.3)
Total Bilirubin: 0.8 mg/dL (ref 0.2–1.2)
Total Protein: 7.7 g/dL (ref 6.0–8.3)

## 2019-02-25 LAB — VITAMIN D 25 HYDROXY (VIT D DEFICIENCY, FRACTURES): VITD: 22.77 ng/mL — ABNORMAL LOW (ref 30.00–100.00)

## 2019-02-25 LAB — VITAMIN B12: Vitamin B-12: 329 pg/mL (ref 211–911)

## 2019-02-25 LAB — TSH: TSH: 0.7 u[IU]/mL (ref 0.35–4.50)

## 2019-02-25 LAB — LDL CHOLESTEROL, DIRECT: Direct LDL: 144 mg/dL

## 2019-02-25 LAB — PSA: PSA: 0.26 ng/mL (ref 0.10–4.00)

## 2019-02-25 MED ORDER — METFORMIN HCL ER 500 MG PO TB24: ORAL_TABLET | ORAL | 3 refills | Status: DC

## 2019-02-25 NOTE — Assessment & Plan Note (Signed)
For cardiac ct score 

## 2019-02-25 NOTE — Assessment & Plan Note (Signed)

## 2019-02-25 NOTE — Assessment & Plan Note (Signed)
For f/u lab 

## 2019-02-25 NOTE — Progress Notes (Signed)
Subjective:    Patient ID: Jonathan Watkins, male    DOB: 19-Nov-1975, 44 y.o.   MRN: [id number]  HPI  Here for wellness and f/u;  Overall doing ok;  Pt denies Chest pain, worsening SOB, DOE, wheezing, orthopnea, PND, worsening LE edema, palpitations, dizziness or syncope.  Pt denies neurological change such as new headache, facial or extremity weakness.  Pt denies polydipsia, polyuria, or low sugar symptoms. Pt states overall good compliance with treatment and medications, good tolerability, and has been trying to follow appropriate diet.  Pt denies worsening depressive symptoms, suicidal ideation or panic. No fever, night sweats, wt loss, loss of appetite, or other constitutional symptoms.  Pt states good ability with ADL's, has low fall risk, home safety reviewed and adequate, no other significant changes in hearing or vision, and only occasionally active with exercise. [place] to [place] Dr Gershon Crane for eye exam, last done oct 2019 in [place].    Working from home, kids going part [employer].  No new complaints Past Medical History:  Diagnosis Date  . Abdominal pain, left lower quadrant 10/29/2008  . ALLERGIC RHINITIS 10/29/2008  . Diabetes mellitus without complication ([place])   . ELEVATED BLOOD PRESSURE WITHOUT DIAGNOSIS OF HYPERTENSION 10/22/2007  . ERECTILE DYSFUNCTION 01/26/2008  . GERD 10/22/2007  . Headache(784.0) 05/02/2009  . HEMATOCHEZIA 11/30/2008  . HYPERLIPIDEMIA 03/21/2007  . Impaired glucose tolerance 03/21/2011  . NECK PAIN 12/15/2009  . Other dysphagia 05/02/2009  . Personal history of colonic polyps 12/28/2008  . Renal stones 03/21/2011  . SEIZURE DISORDER 03/21/2007   Past Surgical History:  Procedure Laterality Date  . ANAL FISSURE REPAIR    . nasal surgury      reports that he has never smoked. He has never used smokeless tobacco. He reports current alcohol use. He reports that he does not use drugs. family history is not on file. No Known Allergies Current Outpatient Medications on  File Prior to Visit  Medication Sig Dispense Refill  . clindamycin (CLEOCIN T) 1 % SWAB     . Diclofenac Sodium (PENNSAID) 2 % SOLN Place 1 application onto the skin 2 (two) times daily. 1 Bottle 3  . ibuprofen (ADVIL,MOTRIN) 200 MG tablet Take 200 mg by mouth every 6 (six) hours as needed for moderate pain.    Marland Kitchen losartan (COZAAR) 25 MG tablet Take 1 tablet (25 mg total) by mouth daily. 90 tablet 3  . meloxicam (MOBIC) 15 MG tablet Take 1 tablet (15 mg total) by mouth daily. 30 tablet 0  . ondansetron (ZOFRAN) 4 MG tablet Take 1 tablet (4 mg total) by mouth every 8 (eight) hours as needed for nausea or vomiting. 6 tablet 0  . ranitidine (ZANTAC) 150 MG capsule Take 150 mg by mouth daily as needed for heartburn.    . rosuvastatin (CRESTOR) 20 MG tablet Take 1 tablet (20 mg total) by mouth daily. 90 tablet 3  . pantoprazole (PROTONIX) 20 MG tablet TAKE 1 TABLET (20 MG TOTAL) BY MOUTH 2 (TWO) TIMES DAILY. (Patient not taking: Reported on 02/25/2019) 60 tablet 5  . tadalafil (CIALIS) 20 MG tablet Take 1 tablet (20 mg total) by mouth daily as needed for erectile dysfunction. 10 tablet 11   No current facility-administered medications on file prior to visit.   Review of Systems Constitutional: Negative for other unusual diaphoresis, sweats, appetite or weight changes HENT: Negative for other worsening hearing loss, ear pain, facial swelling, mouth sores or neck stiffness.   Eyes: Negative for other  worsening pain, redness or other visual disturbance.  Respiratory: Negative for other stridor or swelling Cardiovascular: Negative for other palpitations or other chest pain  Gastrointestinal: Negative for worsening diarrhea or loose stools, blood in stool, distention or other pain Genitourinary: Negative for hematuria, flank pain or other change in urine volume.  Musculoskeletal: Negative for myalgias or other joint swelling.  Skin: Negative for other color change, or other wound or worsening drainage.   Neurological: Negative for other syncope or numbness. Hematological: Negative for other adenopathy or swelling Psychiatric/Behavioral: Negative for hallucinations, other worsening agitation, SI, self-injury, or new decreased concentration All otherwise neg per pt     Objective:   Physical Exam BP (!) 150/92 (BP Location: Left Arm, Patient Position: Sitting, Cuff Size: Large)   Pulse 83   Temp 98.9 F (37.2 C) (Oral)   Ht 5\' 9"  (1.753 m)   Wt 204 lb (92.5 kg)   SpO2 97%   BMI 30.13 kg/m  VS noted,  Constitutional: Pt is oriented to person, place, and time. Appears well-developed and well-nourished, in no significant distress and comfortable Head: Normocephalic and atraumatic  Eyes: Conjunctivae and EOM are normal. Pupils are equal, round, and reactive to light Right Ear: External ear normal without discharge Left Ear: External ear normal without discharge Nose: Nose without discharge or deformity Mouth/Throat: Oropharynx is without other ulcerations and moist  Neck: Normal range of motion. Neck supple. No JVD present. No tracheal deviation present or significant neck LA or mass Cardiovascular: Normal rate, regular rhythm, normal heart sounds and intact distal pulses.   Pulmonary/Chest: WOB normal and breath sounds without rales or wheezing  Abdominal: Soft. Bowel sounds are normal. NT. No HSM  Musculoskeletal: Normal range of motion. Exhibits no edema Lymphadenopathy: Has no other cervical adenopathy.  Neurological: Pt is alert and oriented to person, place, and time. Pt has normal reflexes. No cranial nerve deficit. Motor grossly intact, Gait intact Skin: Skin is warm and dry. No rash noted or new ulcerations Psychiatric:  Has normal mood and affect. Behavior is normal without agitation All otherwise neg per pt Lab Results  Component Value Date   WBC 5.2 11/08/2016   HGB 14.9 11/08/2016   HCT 45.1 11/08/2016   PLT 206.0 Repeated and verified X2. 11/08/2016   GLUCOSE 105 (H)  05/21/2017   CHOL 228 (H) 05/21/2017   TRIG 235.0 (H) 05/21/2017   HDL 41.70 05/21/2017   LDLDIRECT 130.0 05/21/2017   LDLCALC 113 (H) 02/03/2010   ALT 12 05/21/2017   AST 12 05/21/2017   NA 136 05/21/2017   K 3.9 05/21/2017   CL 100 05/21/2017   CREATININE 0.94 05/21/2017   BUN 10 05/21/2017   CO2 30 05/21/2017   TSH 1.43 11/08/2016   PSA 0.24 11/08/2016   HGBA1C 6.8 (H) 05/21/2017   MICROALBUR 1.3 12/21/2015         Assessment & Plan:

## 2019-02-25 NOTE — Patient Instructions (Addendum)
Please continue all other medications as before, and refills have been done if requested.  Please have the pharmacy call with any other refills you may need.  Please continue your efforts at being more active, low cholesterol diet, and weight control.  You are otherwise up to date with prevention measures today.  Please keep your appointments with your specialists as you may have planned  Please check the Blood Pressure at home daily and call in 2 weeks with the average  We have discussed the Cardiac CT Score test to measure the calcification level (if any) in your heart arteries.  This test has been ordered in our [facility], so please call  CT directly, as they prefer this, at [phone] to be scheduled.  Please go to the LAB at the blood drawing area for the tests to be done  You will be contacted by phone if any changes need to be made immediately.  Otherwise, you will receive a letter about your results with an explanation, but please check with MyChart first.  Please remember to sign up for MyChart if you have not done so, as this will be important to you in the future with finding out test results, communicating by private email, and scheduling acute appointments online when needed.  Please return in 6 months, or sooner if needed, with Lab testing done 3-5 days before at the [facility] - please make appt as you leave today for this

## 2019-02-25 NOTE — Assessment & Plan Note (Signed)
stable overall by history and exam, recent data reviewed with pt, and pt to continue medical treatment as before,  to f/u any worsening symptoms or concerns  

## 2019-02-25 NOTE — Assessment & Plan Note (Signed)
For recheck at home, declines med change

## 2019-02-26 ENCOUNTER — Other Ambulatory Visit: Payer: Self-pay | Admitting: Internal Medicine

## 2019-02-26 LAB — URINALYSIS, ROUTINE W REFLEX MICROSCOPIC
Bilirubin Urine: NEGATIVE
Hgb urine dipstick: NEGATIVE
Ketones, ur: NEGATIVE
Leukocytes,Ua: NEGATIVE
Nitrite: NEGATIVE
RBC / HPF: NONE SEEN (ref 0–?)
Specific Gravity, Urine: 1.025 (ref 1.000–1.030)
Total Protein, Urine: NEGATIVE
Urine Glucose: NEGATIVE
Urobilinogen, UA: 0.2 (ref 0.0–1.0)
WBC, UA: NONE SEEN (ref 0–?)
pH: 6 (ref 5.0–8.0)

## 2019-02-26 LAB — HEMOGLOBIN A1C: Hgb A1c MFr Bld: 7.3 % — ABNORMAL HIGH (ref 4.6–6.5)

## 2019-02-26 MED ORDER — METFORMIN HCL ER 500 MG PO TB24: ORAL_TABLET | ORAL | 3 refills | Status: DC

## 2019-02-26 MED ORDER — VITAMIN D (ERGOCALCIFEROL) 1.25 MG (50000 UNIT) PO CAPS: 50000.0000 [IU] | ORAL_CAPSULE | ORAL | 0 refills | Status: DC

## 2019-02-26 MED ORDER — ROSUVASTATIN CALCIUM 40 MG PO TABS: 40.0000 mg | ORAL_TABLET | Freq: Every day | ORAL | 3 refills | Status: DC

## 2019-03-11 ENCOUNTER — Encounter: Payer: Self-pay | Admitting: Internal Medicine

## 2019-08-19 ENCOUNTER — Other Ambulatory Visit: Payer: 59

## 2019-08-25 ENCOUNTER — Ambulatory Visit: Payer: 59 | Admitting: Internal Medicine

## 2019-08-25 DIAGNOSIS — Z0289 Encounter for other administrative examinations: Secondary | ICD-10-CM

## 2020-02-17 ENCOUNTER — Ambulatory Visit: Payer: 59 | Admitting: Internal Medicine

## 2020-02-17 ENCOUNTER — Encounter: Payer: Self-pay | Admitting: Internal Medicine

## 2020-02-17 ENCOUNTER — Other Ambulatory Visit: Payer: Self-pay

## 2020-02-17 VITALS — BP 120/82 | HR 87 | Temp 98.7°F | Ht 69.0 in | Wt 188.0 lb

## 2020-02-17 DIAGNOSIS — E7849 Other hyperlipidemia: Secondary | ICD-10-CM

## 2020-02-17 DIAGNOSIS — Z0000 Encounter for general adult medical examination without abnormal findings: Secondary | ICD-10-CM | POA: Diagnosis not present

## 2020-02-17 DIAGNOSIS — E559 Vitamin D deficiency, unspecified: Secondary | ICD-10-CM

## 2020-02-17 DIAGNOSIS — R809 Proteinuria, unspecified: Secondary | ICD-10-CM

## 2020-02-17 DIAGNOSIS — Z0001 Encounter for general adult medical examination with abnormal findings: Secondary | ICD-10-CM

## 2020-02-17 DIAGNOSIS — E538 Deficiency of other specified B group vitamins: Secondary | ICD-10-CM

## 2020-02-17 DIAGNOSIS — E1165 Type 2 diabetes mellitus with hyperglycemia: Secondary | ICD-10-CM | POA: Diagnosis not present

## 2020-02-17 DIAGNOSIS — I10 Essential (primary) hypertension: Secondary | ICD-10-CM | POA: Diagnosis not present

## 2020-02-17 DIAGNOSIS — E119 Type 2 diabetes mellitus without complications: Secondary | ICD-10-CM

## 2020-02-17 LAB — BASIC METABOLIC PANEL
BUN: 20 mg/dL (ref 6–23)
CO2: 30 mEq/L (ref 19–32)
Calcium: 9.6 mg/dL (ref 8.4–10.5)
Chloride: 102 mEq/L (ref 96–112)
Creatinine, Ser: 0.92 mg/dL (ref 0.40–1.50)
GFR: 100.82 mL/min (ref >60.00)
Glucose, Bld: 123 mg/dL — ABNORMAL HIGH (ref 70–99)
Potassium: 4.4 mEq/L (ref 3.5–5.1)
Sodium: 138 mEq/L (ref 135–145)

## 2020-02-17 LAB — LIPID PANEL
Cholesterol: 230 mg/dL — ABNORMAL HIGH (ref 0–200)
HDL: 42.7 mg/dL (ref >39.00)
LDL Cholesterol: 149 mg/dL — ABNORMAL HIGH (ref 0–99)
NonHDL: 187.67
Total CHOL/HDL Ratio: 5
Triglycerides: 193 mg/dL — ABNORMAL HIGH (ref 0.0–149.0)
VLDL: 38.6 mg/dL (ref 0.0–40.0)

## 2020-02-17 LAB — HEPATIC FUNCTION PANEL
ALT: 24 U/L (ref 0–53)
AST: 17 U/L (ref 0–37)
Albumin: 4.8 g/dL (ref 3.5–5.2)
Alkaline Phosphatase: 49 U/L (ref 39–117)
Bilirubin, Direct: 0.1 mg/dL (ref 0.0–0.3)
Total Bilirubin: 0.6 mg/dL (ref 0.2–1.2)
Total Protein: 7.6 g/dL (ref 6.0–8.3)

## 2020-02-17 LAB — VITAMIN D 25 HYDROXY (VIT D DEFICIENCY, FRACTURES): VITD: 28.9 ng/mL — ABNORMAL LOW (ref 30.00–100.00)

## 2020-02-17 LAB — HEMOGLOBIN A1C: Hgb A1c MFr Bld: 6.9 % — ABNORMAL HIGH (ref 4.6–6.5)

## 2020-02-17 LAB — VITAMIN B12: Vitamin B-12: 243 pg/mL (ref 211–911)

## 2020-02-17 MED ORDER — THERA-D 2000 50 MCG (2000 UT) PO TABS: ORAL_TABLET | ORAL | 99 refills | Status: DC

## 2020-02-17 MED ORDER — ROSUVASTATIN CALCIUM 40 MG PO TABS: 40.0000 mg | ORAL_TABLET | Freq: Every day | ORAL | 3 refills | Status: DC

## 2020-02-17 MED ORDER — METFORMIN HCL ER 500 MG PO TB24: ORAL_TABLET | ORAL | 3 refills | Status: DC

## 2020-02-17 MED ORDER — LOSARTAN POTASSIUM 25 MG PO TABS: 25.0000 mg | ORAL_TABLET | Freq: Every day | ORAL | 3 refills | Status: DC

## 2020-02-17 NOTE — Progress Notes (Addendum)
Established Patient Office Visit  Subjective:  Patient ID: Jonathan Watkins, male    DOB: [birth year]  Age: 45 y.o. MRN: [id number]       Chief Complaint: ( wellness and low vit d, DM and HLD       HPI:  Jonathan Watkins is a 45 y.o. male here for wellness ,  Due for colonoscopy  - delcines today but plans to call back with plan for march.  No new complaints. Pt denies chest pain, increased sob or doe, wheezing, orthopnea, PND, increased LE swelling, palpitations, dizziness or syncope.  Pt denies new neurological symptoms such as new headache, or facial or extremity weakness or numbness   Pt denies polydipsia, polyuria,  Pt states overall good compliance with meds, trying to follow lower cholesterol diet, wt overall down over 20 lbs with better diet!  Wt Readings from Last 3 Encounters:  02/17/20 188 lb (85.3 kg)  02/25/19 204 lb (92.5 kg)  06/06/17 212 lb (96.2 kg)   BP Readings from Last 3 Encounters:  02/17/20 120/82  02/25/19 (!) 150/92  06/06/17 128/68        Past Medical History:  Diagnosis Date  . Abdominal pain, left lower quadrant 10/29/2008  . ALLERGIC RHINITIS 10/29/2008  . Diabetes mellitus without complication ([facility])   . ELEVATED BLOOD PRESSURE WITHOUT DIAGNOSIS OF HYPERTENSION 10/22/2007  . ERECTILE DYSFUNCTION 01/26/2008  . GERD 10/22/2007  . Headache(784.0) 05/02/2009  . HEMATOCHEZIA 11/30/2008  . HYPERLIPIDEMIA 03/21/2007  . Impaired glucose tolerance 03/21/2011  . NECK PAIN 12/15/2009  . Other dysphagia 05/02/2009  . Personal history of colonic polyps 12/28/2008  . Renal stones 03/21/2011  . SEIZURE DISORDER 03/21/2007   Past Surgical History:  Procedure Laterality Date  . ANAL FISSURE REPAIR    . nasal surgury      reports that he has never smoked. He has never used smokeless tobacco. He reports current alcohol use. He reports that he does not use drugs. family history is not on file. No Known Allergies Current Outpatient Medications on File Prior to Visit  Medication Sig  Dispense Refill  . clindamycin (CLEOCIN T) 1 % SWAB     . Diclofenac Sodium (PENNSAID) 2 % SOLN Place 1 application onto the skin 2 (two) times daily. 1 Bottle 3  . ibuprofen (ADVIL,MOTRIN) 200 MG tablet Take 200 mg by mouth every 6 (six) hours as needed for moderate pain.    Marland Kitchen ondansetron (ZOFRAN) 4 MG tablet Take 1 tablet (4 mg total) by mouth every 8 (eight) hours as needed for nausea or vomiting. 6 tablet 0  . ranitidine (ZANTAC) 150 MG capsule Take 150 mg by mouth daily as needed for heartburn.    . tadalafil (CIALIS) 20 MG tablet Take 1 tablet (20 mg total) by mouth daily as needed for erectile dysfunction. 10 tablet 11   No current facility-administered medications on file prior to visit.        ROS:  All others reviewed and negative.  Objective        PE:  BP 120/82 (BP Location: Left Arm, Patient Position: Sitting, Cuff Size: Large)   Pulse 87   Temp 98.7 F (37.1 C) (Oral)   Ht 5\' 9"  (1.753 m)   Wt 188 lb (85.3 kg)   SpO2 98%   BMI 27.76 kg/m                 Constitutional: Pt appears in NAD  HENT: Head: NCAT.                Right Ear: External ear normal.                 Left Ear: External ear normal.                Eyes: . Pupils are equal, round, and reactive to light. Conjunctivae and EOM are normal               Nose: without d/c or deformity               Neck: Neck supple. Gross normal ROM               Cardiovascular: Normal rate and regular rhythm.                 Pulmonary/Chest: Effort normal and breath sounds without rales or wheezing.                Abd:  Soft, NT, ND, + BS, no organomegaly               Neurological: Pt is alert. At baseline orientation, motor grossly intact               Skin: Skin is warm. No rashes, no other new lesions, LE edema - none               Psychiatric: Pt behavior is normal without agitation   Assessment/Plan:  Jonathan Watkins is a 45 y.o. White or Caucasian [1] male with  has a past medical history of Abdominal  pain, left lower quadrant (10/29/2008), ALLERGIC RHINITIS (10/29/2008), Diabetes mellitus without complication ([place]), ELEVATED BLOOD PRESSURE WITHOUT DIAGNOSIS OF HYPERTENSION (10/22/2007), ERECTILE DYSFUNCTION (01/26/2008), GERD (10/22/2007), Headache(784.0) (05/02/2009), HEMATOCHEZIA (11/30/2008), HYPERLIPIDEMIA (03/21/2007), Impaired glucose tolerance (03/21/2011), NECK PAIN (12/15/2009), Other dysphagia (05/02/2009), Personal history of colonic polyps (12/28/2008), Renal stones (03/21/2011), and SEIZURE DISORDER (03/21/2007).  Assessment Plan  See notes Labs reviewed for each problem: Lab Results  Component Value Date   WBC 4.9 02/25/2019   HGB 15.3 02/25/2019   HCT 46.3 02/25/2019   PLT 227.0 02/25/2019   GLUCOSE 123 (H) 02/17/2020   CHOL 230 (H) 02/17/2020   TRIG 193.0 (H) 02/17/2020   HDL 42.70 02/17/2020   LDLDIRECT 144.0 02/25/2019   LDLCALC 149 (H) 02/17/2020   ALT 24 02/17/2020   AST 17 02/17/2020   NA 138 02/17/2020   K 4.4 02/17/2020   CL 102 02/17/2020   CREATININE 0.92 02/17/2020   BUN 20 02/17/2020   CO2 30 02/17/2020   TSH 0.70 02/25/2019   PSA 0.26 02/25/2019   HGBA1C 6.9 (H) 02/17/2020   MICROALBUR 2.4 (H) 02/25/2019    Micro: none  Cardiac tracings I have personally interpreted today:  none  Pertinent Radiological findings (summarize): none    Health Maintenance Due  Topic Date Due  . Hepatitis C Screening  Never done  . COLONOSCOPY (Pts 45-51yrs Insurance coverage will need to be confirmed)  02/15/2020  . FOOT EXAM  02/25/2020    There are no preventive care reminders to display for this patient.  Lab Results  Component Value Date   TSH 0.70 02/25/2019   Lab Results  Component Value Date   WBC 4.9 02/25/2019   HGB 15.3 02/25/2019   HCT 46.3 02/25/2019   MCV 87.7 02/25/2019   PLT 227.0 02/25/2019   Lab Results  Component Value Date   NA 138 02/17/2020  K 4.4 02/17/2020   CO2 30 02/17/2020   GLUCOSE 123 (H) 02/17/2020   BUN 20 02/17/2020    CREATININE 0.92 02/17/2020   BILITOT 0.6 02/17/2020   ALKPHOS 49 02/17/2020   AST 17 02/17/2020   ALT 24 02/17/2020   PROT 7.6 02/17/2020   ALBUMIN 4.8 02/17/2020   CALCIUM 9.6 02/17/2020   ANIONGAP 11 07/13/2015   GFR 100.82 02/17/2020   Lab Results  Component Value Date   CHOL 230 (H) 02/17/2020   Lab Results  Component Value Date   HDL 42.70 02/17/2020   Lab Results  Component Value Date   LDLCALC 149 (H) 02/17/2020   Lab Results  Component Value Date   TRIG 193.0 (H) 02/17/2020   Lab Results  Component Value Date   CHOLHDL 5 02/17/2020   Lab Results  Component Value Date   HGBA1C 6.9 (H) 02/17/2020      Assessment & Plan:   Problem List Items Addressed This Visit      High   Encounter for well adult exam with abnormal findings - Primary    Overall doing well, age appropriate education and counseling updated, referrals for preventative services and immunizations addressed, dietary and smoking counseling addressed, most recent labs reviewed.  I have personally reviewed and have noted:  1) the patient's medical and social history 2) The pt's use of alcohol, tobacco, and illicit drugs 3) The patient's current medications and supplements 4) Functional ability including ADL's, fall risk, home safety risk, hearing and visual impairment 5) Diet and physical activities 6) Evidence for depression or mood disorder 7) The patient's height, weight, and BMI have been recorded in the chart  I have made referrals, and provided counseling and education based on review of the above Pt will call when wants to schedule the sreening colonoscopy        Medium   Vitamin D deficiency    To start OTC vit d3 2000 u qd      Microalbuminuria    Very mild, to cont losartan 25 mg daily      Hyperlipidemia    Uncontrolled, goal < 70, has not started statin but now willing, to start crestor 40 qd      Relevant Medications   losartan (COZAAR) 25 MG tablet   rosuvastatin  (CRESTOR) 40 MG tablet   HTN (hypertension)    BP Readings from Last 3 Encounters:  02/17/20 120/82  02/25/19 (!) 150/92  06/06/17 128/68  stable, to cont losartan 25 mg      Relevant Medications   losartan (COZAAR) 25 MG tablet   rosuvastatin (CRESTOR) 40 MG tablet   Diabetes ([place])    With recent significant wt loss, for a1c and consider decrease metformin ER 500 , but to cont 1500 am for now      Relevant Medications   losartan (COZAAR) 25 MG tablet   metFORMIN (GLUCOPHAGE-XR) 500 MG 24 hr tablet   rosuvastatin (CRESTOR) 40 MG tablet    Other Visit Diagnoses    B12 deficiency          Meds ordered this encounter  Medications  . losartan (COZAAR) 25 MG tablet    Sig: Take 1 tablet (25 mg total) by mouth daily.    Dispense:  90 tablet    Refill:  3  . metFORMIN (GLUCOPHAGE-XR) 500 MG 24 hr tablet    Sig: TAKE 3 TABLETS BY MOUTH DAILY WITH BREAKFAST    Dispense:  270 tablet    Refill:  3  . rosuvastatin (CRESTOR) 40 MG tablet    Sig: Take 1 tablet (40 mg total) by mouth daily.    Dispense:  90 tablet    Refill:  3  . Cholecalciferol (THERA-D 2000) 50 MCG (2000 UT) TABS    Sig: 1 tab by mouth once daily    Dispense:  90 tablet    Refill:  99    Follow-up: Return in about 6 months (around 08/16/2020).   [name], MD 02/17/2020 8:38 AM [facility]

## 2020-02-17 NOTE — Assessment & Plan Note (Signed)
Very mild, to cont losartan 25 mg daily

## 2020-02-17 NOTE — Assessment & Plan Note (Signed)
With recent significant wt loss, for a1c and consider decrease metformin ER 500 , but to cont 1500 am for now

## 2020-02-17 NOTE — Assessment & Plan Note (Signed)
To start OTC vit d3 2000 u qd

## 2020-02-17 NOTE — Patient Instructions (Signed)
Please call when you wish to be referred for the screening colonoscopy, and remember this may take 1-3 months to be scheduled  Please take OTC Vitamin D3 at 2000 units per day, indefinitely.  Please take all new medication as prescribed - the crestor 40 mg per day  Please continue all other medications as before, and refills have been done if requested.  Please have the pharmacy call with any other refills you may need.  Please continue your efforts at being more active, low cholesterol diet, and weight control.  You are otherwise up to date with prevention measures today.  Please keep your appointments with your specialists as you may have planned  Please go to the LAB at the blood drawing area for the tests to be done  You will be contacted by phone if any changes need to be made immediately.  Otherwise, you will receive a letter about your results with an explanation, but please check with MyChart first.  Please remember to sign up for MyChart if you have not done so, as this will be important to you in the future with finding out test results, communicating by private email, and scheduling acute appointments online when needed.  Please make an Appointment to return in 6 months, or sooner if needed

## 2020-02-17 NOTE — Assessment & Plan Note (Signed)
BP Readings from Last 3 Encounters:  02/17/20 120/82  02/25/19 (!) 150/92  06/06/17 128/68  stable, to cont losartan 25 mg

## 2020-02-17 NOTE — Assessment & Plan Note (Signed)
Overall doing well, age appropriate education and counseling updated, referrals for preventative services and immunizations addressed, dietary and smoking counseling addressed, most recent labs reviewed.  I have personally reviewed and have noted:  1) the patient's medical and social history 2) The pt's use of alcohol, tobacco, and illicit drugs 3) The patient's current medications and supplements 4) Functional ability including ADL's, fall risk, home safety risk, hearing and visual impairment 5) Diet and physical activities 6) Evidence for depression or mood disorder 7) The patient's height, weight, and BMI have been recorded in the chart  I have made referrals, and provided counseling and education based on review of the above Pt will call when wants to schedule the sreening colonoscopy

## 2020-02-17 NOTE — Assessment & Plan Note (Signed)
Uncontrolled, goal < 70, has not started statin but now willing, to start crestor 40 qd

## 2020-02-29 ENCOUNTER — Encounter: Payer: Self-pay | Admitting: Internal Medicine

## 2020-03-18 ENCOUNTER — Other Ambulatory Visit: Payer: Self-pay | Admitting: Internal Medicine

## 2020-03-20 ENCOUNTER — Other Ambulatory Visit: Payer: Self-pay | Admitting: Internal Medicine

## 2020-05-11 ENCOUNTER — Telehealth: Payer: Self-pay | Admitting: Internal Medicine

## 2020-05-11 DIAGNOSIS — Z1211 Encounter for screening for malignant neoplasm of colon: Secondary | ICD-10-CM

## 2020-05-11 NOTE — Telephone Encounter (Signed)
   Patient requesting referral to  [facility] for colonoscopy

## 2020-05-11 NOTE — Telephone Encounter (Signed)
Ok this is done 

## 2020-05-12 NOTE — Telephone Encounter (Signed)
Tried calling patient, no answer.

## 2020-05-13 ENCOUNTER — Ambulatory Visit: Payer: 59 | Admitting: Internal Medicine

## 2020-05-13 NOTE — Telephone Encounter (Signed)
Patient has been notified

## 2020-05-20 ENCOUNTER — Encounter: Payer: Self-pay | Admitting: Gastroenterology

## 2020-07-22 ENCOUNTER — Other Ambulatory Visit: Payer: Self-pay

## 2020-07-22 ENCOUNTER — Ambulatory Visit (AMBULATORY_SURGERY_CENTER): Payer: Managed Care, Other (non HMO)

## 2020-07-22 VITALS — Ht 70.5 in | Wt 185.0 lb

## 2020-07-22 DIAGNOSIS — Z86010 Personal history of colonic polyps: Secondary | ICD-10-CM

## 2020-07-22 NOTE — Progress Notes (Signed)
Pre visit completed via phone call; Patient verified name, DOB, and address; No egg or soy allergy known to patient  No issues with past sedation with any surgeries or procedures Patient denies ever being told they had issues or difficulty with intubation  No FH of Malignant Hyperthermia No diet pills per patient No home 02 use per patient  No blood thinners per patient  Pt denies issues with constipation  No A fib or A flutter  EMMI video via MyChart  COVID 19 guidelines implemented in [facility] today with Pt and RN  Pt is fully vaccinated for Covid x 2 + booster;  NO PA's for preps discussed with pt in [facility] today  Discussed with pt there will be an out-of-pocket cost for prep and that varies from $0 to 70 dollars   Due to the COVID-19 pandemic we are asking patients to follow certain guidelines.  Pt aware of COVID protocols and [facility] guidelines

## 2020-08-02 ENCOUNTER — Encounter: Payer: Self-pay | Admitting: Gastroenterology

## 2020-08-03 ENCOUNTER — Telehealth: Payer: Self-pay | Admitting: Gastroenterology

## 2020-08-05 ENCOUNTER — Encounter: Payer: Self-pay | Admitting: Gastroenterology

## 2020-12-21 ENCOUNTER — Ambulatory Visit: Payer: Managed Care, Other (non HMO) | Admitting: Emergency Medicine

## 2020-12-21 ENCOUNTER — Encounter: Payer: Self-pay | Admitting: Emergency Medicine

## 2020-12-21 ENCOUNTER — Other Ambulatory Visit: Payer: Self-pay

## 2020-12-21 VITALS — BP 136/82 | HR 77 | Ht 70.0 in | Wt 193.0 lb

## 2020-12-21 DIAGNOSIS — M549 Dorsalgia, unspecified: Secondary | ICD-10-CM

## 2020-12-21 DIAGNOSIS — M62830 Muscle spasm of back: Secondary | ICD-10-CM

## 2020-12-21 DIAGNOSIS — S39012A Strain of muscle, fascia and tendon of lower back, initial encounter: Secondary | ICD-10-CM

## 2020-12-21 MED ORDER — CYCLOBENZAPRINE HCL 10 MG PO TABS: 10.0000 mg | ORAL_TABLET | Freq: Every day | ORAL | 0 refills | Status: DC

## 2020-12-21 MED ORDER — METHYLPREDNISOLONE 4 MG PO TBPK: ORAL_TABLET | ORAL | 1 refills | Status: DC

## 2020-12-21 MED ORDER — TRAMADOL HCL 50 MG PO TABS: 50.0000 mg | ORAL_TABLET | Freq: Three times a day (TID) | ORAL | 0 refills | Status: AC | PRN

## 2020-12-21 NOTE — Patient Instructions (Signed)

## 2020-12-21 NOTE — Progress Notes (Signed)
Jonathan Watkins 45 y.o.   Chief Complaint  Patient presents with   Back Pain    X Saturday morning, low back pain. Pt would like something for pain, work note.    HISTORY OF PRESENT ILLNESS: This is a 45 y.o. male complaining of low back pain that started last week.  Was recently in [facility], flew back last Saturday. Complaining of constant lumbar sharp pain worse with movement and better with rest.  No radiation.  No associated symptoms.  No bowel or bladder symptoms.  Requesting pain medication and also requesting work note. No other complaints or medical concerns today.  Back Pain Pertinent negatives include no abdominal pain, chest pain, dysuria, fever or headaches.    Prior to Admission medications   Medication Sig Start Date End Date Taking? Authorizing Provider  clindamycin (CLEOCIN T) 1 % SWAB  01/09/19  Yes [provider]  fish oil-omega-3 fatty acids 1000 MG capsule Take 4 g by mouth daily.   Yes [provider]  losartan (COZAAR) 25 MG tablet Take 1 tablet (25 mg total) by mouth daily. 02/17/20  Yes Biagio Borg, MD  MAGNESIUM PO Take 2 tablets by mouth daily.   Yes [provider]  metFORMIN (GLUCOPHAGE-XR) 500 MG 24 hr tablet TAKE 3 TABLETS BY MOUTH DAILY WITH BREAKFAST 02/17/20  Yes Biagio Borg, MD  Multiple Vitamins-Minerals (MENS MULTIVITAMIN PO) Take 1 tablet by mouth daily at 6 (six) AM.   Yes [provider]  ranitidine (ZANTAC) 150 MG capsule Take 150 mg by mouth daily as needed for heartburn.   Yes [provider]  rosuvastatin (CRESTOR) 40 MG tablet Take 1 tablet (40 mg total) by mouth daily. 02/17/20  Yes Biagio Borg, MD    No Known Allergies  Patient Active Problem List   Diagnosis Date Noted   Microalbuminuria 02/17/2020   Right elbow pain 06/06/2017   Medial epicondylitis of right elbow 05/21/2017   Pilonidal cyst without infection 05/21/2017   Tail bone pain 05/21/2017   Exposure to STD 05/21/2017    Vitamin D deficiency 01/12/2017   HTN (hypertension) 12/29/2015   Left leg pain 11/27/2012   Renal stones 03/21/2011   Diabetes ([place]) 03/21/2011   Encounter for well adult exam with abnormal findings 03/17/2011   NECK PAIN 12/15/2009   Headache(784.0) 05/02/2009   Other dysphagia 05/02/2009   Personal history of colonic polyps 12/28/2008   HEMATOCHEZIA 11/30/2008   ALLERGIC RHINITIS 10/29/2008   Erectile dysfunction 01/26/2008   GERD 10/22/2007   ELEVATED BLOOD PRESSURE WITHOUT DIAGNOSIS OF HYPERTENSION 10/22/2007   Hyperlipidemia 03/21/2007   SEIZURE DISORDER 03/21/2007    Past Medical History:  Diagnosis Date   Abdominal pain, left lower quadrant 10/29/2008   ALLERGIC RHINITIS 10/29/2008   Diabetes mellitus without complication ([place])    ELEVATED BLOOD PRESSURE WITHOUT DIAGNOSIS OF HYPERTENSION 10/22/2007   ERECTILE DYSFUNCTION 01/26/2008   GERD 10/22/2007   Headache(784.0) 05/02/2009   HEMATOCHEZIA 11/30/2008   HYPERLIPIDEMIA 03/21/2007   on meds   Impaired glucose tolerance 03/21/2011   NECK PAIN 12/15/2009   Other dysphagia 05/02/2009   Personal history of colonic polyps 12/28/2008   Renal stones 03/21/2011   SEIZURE DISORDER 03/21/2007    Past Surgical History:  Procedure Laterality Date   ANAL FISSURE REPAIR     COLONOSCOPY  12/2008   RK-F/V-moviprep(exc)-hx of polyps-normal   NASAL FRACTURE SURGERY     nasal fracture sx repair   WISDOM TOOTH EXTRACTION  Social History   Socioeconomic History   Marital status: Married    Spouse name: Not on file   Number of children: 2   Years of education: Not on file   Highest education level: Not on file  Occupational History   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[employer] [place]  Tobacco Use   Smoking status: Never   Smokeless tobacco: Never  Vaping Use   Vaping Use: Never used  Substance and Sexual Activity   Alcohol use: Yes    Comment: 1-2 per month, mixed drink or beer   Drug use: No   Sexual  activity: Not on file  Other Topics Concern   Not on file  Social History Narrative   Not on file   Social Determinants of Health   Financial Resource Strain: Not on file  Food Insecurity: Not on file  Transportation Needs: Not on file  Physical Activity: Not on file  Stress: Not on file  Social Connections: Not on file  Intimate Partner Violence: Not on file    Family History  Problem Relation Age of Onset   Colon polyps Neg Hx    Colon cancer Neg Hx    Esophageal cancer Neg Hx    Stomach cancer Neg Hx    Rectal cancer Neg Hx      Review of Systems  Constitutional: Negative.  Negative for chills and fever.  HENT: Negative.  Negative for congestion and sore throat.   Respiratory: Negative.  Negative for cough and shortness of breath.   Cardiovascular: Negative.  Negative for chest pain and palpitations.  Gastrointestinal:  Negative for abdominal pain, diarrhea, nausea and vomiting.  Genitourinary: Negative.  Negative for dysuria, flank pain and hematuria.  Musculoskeletal:  Positive for back pain.  Skin: Negative.  Negative for rash.  Neurological: Negative.  Negative for dizziness and headaches.  All other systems reviewed and are negative.   Physical Exam Vitals reviewed.  Constitutional:      Appearance: Normal appearance.  HENT:     Head: Normocephalic.  Eyes:     Extraocular Movements: Extraocular movements intact.     Pupils: Pupils are equal, round, and reactive to light.  Cardiovascular:     Rate and Rhythm: Normal rate.  Pulmonary:     Effort: Pulmonary effort is normal.  Abdominal:     Palpations: Abdomen is soft.     Tenderness: There is no abdominal tenderness.  Musculoskeletal:     Cervical back: Normal range of motion.  Skin:    Capillary Refill: Capillary refill takes less than 2 seconds.  Neurological:     General: No focal deficit present.     Mental Status: He is alert and oriented to person, place, and time.     Sensory: No sensory  deficit.     Motor: No weakness.     Coordination: Coordination normal.     Gait: Gait abnormal (Slow due to pain).     Deep Tendon Reflexes:     Reflex Scores:      Patellar reflexes are 1+ on the right side and 1+ on the left side.      Achilles reflexes are 1+ on the right side and 1+ on the left side. Psychiatric:        Mood and Affect: Mood normal.        Behavior: Behavior normal.     ASSESSMENT & PLAN: Problem List Items Addressed This Visit   None Visit Diagnoses     Acute myofascial  strain of lumbar region, initial encounter    -  Primary   Relevant Medications   methylPREDNISolone (MEDROL DOSEPAK) 4 MG TBPK tablet   Musculoskeletal back pain       Relevant Medications   cyclobenzaprine (FLEXERIL) 10 MG tablet   methylPREDNISolone (MEDROL DOSEPAK) 4 MG TBPK tablet   traMADol (ULTRAM) 50 MG tablet   Lumbar paraspinal muscle spasm       Relevant Medications   cyclobenzaprine (FLEXERIL) 10 MG tablet      Patient Instructions  Acute Back Pain, Adult Acute back pain is sudden and usually short-lived. It is often caused by an injury to the muscles and tissues in the back. The injury may result from: A muscle, tendon, or ligament getting overstretched or torn. Ligaments are tissues that connect bones to each other. Lifting something improperly can cause a back strain. Wear and tear (degeneration) of the spinal disks. Spinal disks are circular tissue that provide cushioning between the bones of the spine (vertebrae). Twisting motions, such as while playing sports or doing yard work. A hit to the back. Arthritis. You may have a physical exam, lab tests, and imaging tests to find the cause of your pain. Acute back pain usually goes away with rest and home care. Follow these instructions at home: Managing pain, stiffness, and swelling Take over-the-counter and prescription medicines only as told by your health care provider. Treatment may include medicines for pain and  inflammation that are taken by mouth or applied to the skin, or muscle relaxants. Your health care provider may recommend applying ice during the first 24-48 hours after your pain starts. To do this: Put ice in a plastic bag. Place a towel between your skin and the bag. Leave the ice on for 20 minutes, 2-3 times a day. Remove the ice if your skin turns bright red. This is very important. If you cannot feel pain, heat, or cold, you have a greater risk of damage to the area. If directed, apply heat to the affected area as often as told by your health care provider. Use the heat source that your health care provider recommends, such as a moist heat pack or a heating pad. Place a towel between your skin and the heat source. Leave the heat on for 20-30 minutes. Remove the heat if your skin turns bright red. This is especially important if you are unable to feel pain, heat, or cold. You have a greater risk of getting burned. Activity  Do not stay in bed. Staying in bed for more than 1-2 days can delay your recovery. Sit up and stand up straight. Avoid leaning forward when you sit or hunching over when you stand. If you work at a desk, sit close to it so you do not need to lean over. Keep your chin tucked in. Keep your neck drawn back, and keep your elbows bent at a 90-degree angle (right angle). Sit high and close to the steering wheel when you drive. Add lower back (lumbar) support to your car seat, if needed. Take short walks on even surfaces as soon as you are able. Try to increase the length of time you walk each day. Do not sit, drive, or stand in one place for more than 30 minutes at a time. Sitting or standing for long periods of time can put stress on your back. Do not drive or use heavy machinery while taking prescription pain medicine. Use proper lifting techniques. When you bend and lift, use positions that  put less stress on your back: [place] your knees. Keep the load close to your  body. Avoid twisting. Exercise regularly as told by your health care provider. Exercising helps your back heal faster and helps prevent back injuries by keeping muscles strong and flexible. Work with a physical therapist to make a safe exercise program, as recommended by your health care provider. Do any exercises as told by your physical therapist. Lifestyle Maintain a healthy weight. Extra weight puts stress on your back and makes it difficult to have good posture. Avoid activities or situations that make you feel anxious or stressed. Stress and anxiety increase muscle tension and can make back pain worse. Learn ways to manage anxiety and stress, such as through exercise. General instructions Sleep on a firm mattress in a comfortable position. Try lying on your side with your knees slightly bent. If you lie on your back, put a pillow under your knees. Keep your head and neck in a straight line with your spine (neutral position) when using electronic equipment like smartphones or pads. To do this: Raise your smartphone or pad to look at it instead of bending your head or neck to look down. Put the smartphone or pad at the level of your face while looking at the screen. Follow your treatment plan as told by your health care provider. This may include: Cognitive or behavioral therapy. Acupuncture or massage therapy. Meditation or yoga. Contact a health care provider if: You have pain that is not relieved with rest or medicine. You have increasing pain going down into your legs or buttocks. Your pain does not improve after 2 weeks. You have pain at night. You lose weight without trying. You have a fever or chills. You develop nausea or vomiting. You develop abdominal pain. Get help right away if: You develop new bowel or bladder control problems. You have unusual weakness or numbness in your arms or legs. You feel faint. These symptoms may represent a serious problem that is an emergency.  Do not wait to see if the symptoms will go away. Get medical help right away. Call your local emergency services (911 in the [place] Do not drive yourself to the hospital. Summary Acute back pain is sudden and usually short-lived. Use proper lifting techniques. When you bend and lift, use positions that put less stress on your back. Take over-the-counter and prescription medicines only as told by your health care provider, and apply heat or ice as told. This information is not intended to replace advice given to you by your health care provider. Make sure you discuss any questions you have with your health care provider. Document Revised: 04/22/2020 Document Reviewed: 04/22/2020 Elsevier Patient Education  2022 [place], MD [facility]

## 2021-06-27 ENCOUNTER — Ambulatory Visit: Payer: Managed Care, Other (non HMO) | Admitting: Internal Medicine

## 2021-07-11 ENCOUNTER — Ambulatory Visit: Payer: Managed Care, Other (non HMO) | Admitting: Internal Medicine

## 2021-07-14 ENCOUNTER — Encounter: Payer: Self-pay | Admitting: Internal Medicine

## 2021-07-14 ENCOUNTER — Ambulatory Visit: Payer: Managed Care, Other (non HMO) | Admitting: Internal Medicine

## 2021-07-14 ENCOUNTER — Telehealth: Payer: Self-pay

## 2021-07-14 VITALS — BP 118/78 | HR 76 | Resp 18 | Ht 70.0 in | Wt 191.4 lb

## 2021-07-14 DIAGNOSIS — E1165 Type 2 diabetes mellitus with hyperglycemia: Secondary | ICD-10-CM

## 2021-07-14 DIAGNOSIS — I10 Essential (primary) hypertension: Secondary | ICD-10-CM

## 2021-07-14 DIAGNOSIS — E7800 Pure hypercholesterolemia, unspecified: Secondary | ICD-10-CM

## 2021-07-14 DIAGNOSIS — E538 Deficiency of other specified B group vitamins: Secondary | ICD-10-CM

## 2021-07-14 DIAGNOSIS — Z1211 Encounter for screening for malignant neoplasm of colon: Secondary | ICD-10-CM

## 2021-07-14 DIAGNOSIS — Z1159 Encounter for screening for other viral diseases: Secondary | ICD-10-CM

## 2021-07-14 DIAGNOSIS — E559 Vitamin D deficiency, unspecified: Secondary | ICD-10-CM

## 2021-07-14 DIAGNOSIS — Z125 Encounter for screening for malignant neoplasm of prostate: Secondary | ICD-10-CM | POA: Diagnosis not present

## 2021-07-14 DIAGNOSIS — Z0001 Encounter for general adult medical examination with abnormal findings: Secondary | ICD-10-CM

## 2021-07-14 LAB — BASIC METABOLIC PANEL
BUN: 12 mg/dL (ref 6–23)
CO2: 32 mEq/L (ref 19–32)
Calcium: 10.1 mg/dL (ref 8.4–10.5)
Chloride: 102 mEq/L (ref 96–112)
Creatinine, Ser: 0.87 mg/dL (ref 0.40–1.50)
GFR: 103.55 mL/min (ref >60.00)
Glucose, Bld: 113 mg/dL — ABNORMAL HIGH (ref 70–99)
Potassium: 5 mEq/L (ref 3.5–5.1)
Sodium: 139 mEq/L (ref 135–145)

## 2021-07-14 LAB — CBC WITH DIFFERENTIAL/PLATELET
Basophils Absolute: 0 10*3/uL (ref 0.0–0.1)
Basophils Relative: 0.7 % (ref 0.0–3.0)
Eosinophils Absolute: 0.3 10*3/uL (ref 0.0–0.7)
Eosinophils Relative: 5.3 % — ABNORMAL HIGH (ref 0.0–5.0)
HCT: 44.4 % (ref 39.0–52.0)
Hemoglobin: 14.9 g/dL (ref 13.0–17.0)
Lymphocytes Relative: 31.9 % (ref 12.0–46.0)
Lymphs Abs: 1.6 10*3/uL (ref 0.7–4.0)
MCHC: 33.5 g/dL (ref 30.0–36.0)
MCV: 88.3 fl (ref 78.0–100.0)
Monocytes Absolute: 0.3 10*3/uL (ref 0.1–1.0)
Monocytes Relative: 6.5 % (ref 3.0–12.0)
Neutro Abs: 2.8 10*3/uL (ref 1.4–7.7)
Neutrophils Relative %: 55.6 % (ref 43.0–77.0)
Platelets: 195 10*3/uL (ref 150.0–400.0)
RBC: 5.03 Mil/uL (ref 4.22–5.81)
RDW: 14.6 % (ref 11.5–15.5)
WBC: 5 10*3/uL (ref 4.0–10.5)

## 2021-07-14 LAB — MICROALBUMIN / CREATININE URINE RATIO
Creatinine,U: 69.5 mg/dL
Microalb Creat Ratio: 1 mg/g (ref 0.0–30.0)
Microalb, Ur: <0.7 mg/dL (ref 0.0–1.9)

## 2021-07-14 LAB — URINALYSIS, ROUTINE W REFLEX MICROSCOPIC
Bilirubin Urine: NEGATIVE
Hgb urine dipstick: NEGATIVE
Ketones, ur: NEGATIVE
Leukocytes,Ua: NEGATIVE
Nitrite: NEGATIVE
RBC / HPF: NONE SEEN (ref 0–?)
Specific Gravity, Urine: 1.015 (ref 1.000–1.030)
Total Protein, Urine: NEGATIVE
Urine Glucose: NEGATIVE
Urobilinogen, UA: 0.2 (ref 0.0–1.0)
WBC, UA: NONE SEEN (ref 0–?)
pH: 8 (ref 5.0–8.0)

## 2021-07-14 LAB — TSH: TSH: 0.71 u[IU]/mL (ref 0.35–5.50)

## 2021-07-14 LAB — HEPATIC FUNCTION PANEL
ALT: 11 U/L (ref 0–53)
AST: 12 U/L (ref 0–37)
Albumin: 4.7 g/dL (ref 3.5–5.2)
Alkaline Phosphatase: 43 U/L (ref 39–117)
Bilirubin, Direct: 0.1 mg/dL (ref 0.0–0.3)
Total Bilirubin: 0.6 mg/dL (ref 0.2–1.2)
Total Protein: 7.5 g/dL (ref 6.0–8.3)

## 2021-07-14 LAB — LIPID PANEL
Cholesterol: 278 mg/dL — ABNORMAL HIGH (ref 0–200)
HDL: 46.7 mg/dL (ref >39.00)
NonHDL: 231.26
Total CHOL/HDL Ratio: 6
Triglycerides: 243 mg/dL — ABNORMAL HIGH (ref 0.0–149.0)
VLDL: 48.6 mg/dL — ABNORMAL HIGH (ref 0.0–40.0)

## 2021-07-14 LAB — VITAMIN D 25 HYDROXY (VIT D DEFICIENCY, FRACTURES): VITD: 22.93 ng/mL — ABNORMAL LOW (ref 30.00–100.00)

## 2021-07-14 LAB — PSA: PSA: 0.27 ng/mL (ref 0.10–4.00)

## 2021-07-14 LAB — LDL CHOLESTEROL, DIRECT: Direct LDL: 159 mg/dL

## 2021-07-14 LAB — HEMOGLOBIN A1C: Hgb A1c MFr Bld: 6.7 % — ABNORMAL HIGH (ref 4.6–6.5)

## 2021-07-14 LAB — VITAMIN B12: Vitamin B-12: 553 pg/mL (ref 211–911)

## 2021-07-14 MED ORDER — METFORMIN HCL ER 500 MG PO TB24: ORAL_TABLET | ORAL | 3 refills | Status: DC

## 2021-07-14 MED ORDER — LOSARTAN POTASSIUM 25 MG PO TABS: 25.0000 mg | ORAL_TABLET | Freq: Every day | ORAL | 3 refills | Status: DC

## 2021-07-14 MED ORDER — RYBELSUS 3 MG PO TABS: 3.0000 mg | ORAL_TABLET | Freq: Every day | ORAL | 3 refills | Status: DC

## 2021-07-14 MED ORDER — ROSUVASTATIN CALCIUM 40 MG PO TABS
40.0000 mg | ORAL_TABLET | Freq: Every day | ORAL | 3 refills | Status: DC
Start: 2021-07-14 — End: 2021-07-18

## 2021-07-14 NOTE — Patient Instructions (Addendum)
Please see [facility] for full yearly eye exam  Please take all new medication as prescribed - the rybelsus 3 mg per day  Please continue all other medications as before, and refills have been done if requested.  Please have the pharmacy call with any other refills you may need.  Please continue your efforts at being more active, low cholesterol diet, and weight control.  You are otherwise up to date with prevention measures today.  Please keep your appointments with your specialists as you may have planned  Please go to the LAB at the blood drawing area for the tests to be done  You will be contacted by phone if any changes need to be made immediately.  Otherwise, you will receive a letter about your results with an explanation, but please check with MyChart first.  Please remember to sign up for MyChart if you have not done so, as this will be important to you in the future with finding out test results, communicating by private email, and scheduling acute appointments online when needed.  Please make an Appointment to return in 6 months, or sooner if needed

## 2021-07-14 NOTE — Telephone Encounter (Signed)
PA started    (Key: [id number])

## 2021-07-14 NOTE — Progress Notes (Signed)
Patient ID: Jonathan Watkins, male   DOB: July 20, 1975, 46 y.o.   MRN: [id number]         Chief Complaint:: wellness exam and dm, obesity, htn, hld       HPI:  Jonathan Watkins is a 46 y.o. male here for wellness exam; due for hep c screen, colonoscopy, and plans to make yearly eye exam appt himself - last one was 2 yrs ago; declines covid booster, o/w up to date                        Also Pt denies chest pain, increased sob or doe, wheezing, orthopnea, PND, increased LE swelling, palpitations, dizziness or syncope.   Pt denies polydipsia, polyuria, or new focal neuro s/s.   Has been very difficult to lose wt despite efforts at better diet and activity.  Wt last yr was 176, now up a few lbs again.   Pt denies fever, wt loss, night sweats, loss of appetite, or other constitutional symptoms   Wt Readings from Last 3 Encounters:  07/14/21 191 lb 6.4 oz (86.8 kg)  12/21/20 193 lb (87.5 kg)  07/22/20 185 lb (83.9 kg)   BP Readings from Last 3 Encounters:  07/14/21 118/78  12/21/20 136/82  02/17/20 120/82   Immunization History  Administered Date(s) Administered   Hepatitis A, Adult 10/17/2017   Hepb-cpg 10/17/2017, 11/21/2017   Influenza Whole 12/12/2007, 11/10/2008   Influenza, High Dose Seasonal PF 12/14/2018   Influenza,inj,Quad PF,6+ Mos 11/27/2012, 11/18/2013, 12/16/2014, 12/29/2015, 11/21/2017   Influenza-Unspecified 11/23/2016, 11/23/2019   PFIZER(Purple Top)SARS-COV-2 Vaccination 04/22/2019, 05/13/2019, 11/30/2019   Pneumococcal Conjugate-13 12/16/2014   Pneumococcal Polysaccharide-23 01/10/2017   Td 11/10/2008   Tdap 03/17/2018   Health Maintenance Due  Topic Date Due   Hepatitis C Screening  Never done   COLONOSCOPY (Pts 45-98yr Insurance coverage will need to be confirmed)  02/15/2020   OPHTHALMOLOGY EXAM  07/26/2020      Past Medical History:  Diagnosis Date   Abdominal pain, left lower quadrant 10/29/2008   ALLERGIC RHINITIS 10/29/2008   Diabetes mellitus [place]  complication [place]    ELEVATED BLOOD PRESSURE WITHOUT DIAGNOSIS OF HYPERTENSION 10/22/2007   ERECTILE DYSFUNCTION 01/26/2008   GERD 10/22/2007   Headache(784.0) 05/02/2009   HEMATOCHEZIA 11/30/2008   HYPERLIPIDEMIA 03/21/2007   on meds   Impaired glucose tolerance 03/21/2011   NECK PAIN 12/15/2009   Other dysphagia 05/02/2009   Personal history of colonic polyps 12/28/2008   Renal stones 03/21/2011   SEIZURE DISORDER 03/21/2007   Past Surgical History:  Procedure Laterality Date   ANAL FISSURE REPAIR     COLONOSCOPY  12/2008   RK-F/V-moviprep(exc)-hx of polyps-normal   NASAL FRACTURE SURGERY     nasal fracture sx repair   WISDOM TOOTH EXTRACTION      reports that he has never smoked. He has never used smokeless tobacco. He reports current alcohol use. He reports that he does not use drugs. family history is not on file. No Known Allergies Current Outpatient Medications on File Prior to Visit  Medication Sig Dispense Refill   clindamycin (CLEOCIN T) 1 % SWAB      cyclobenzaprine (FLEXERIL) 10 MG tablet Take 1 tablet (10 mg total) by mouth at bedtime. 30 tablet 0   fish oil-omega-3 fatty acids 1000 MG capsule Take 4 g by mouth daily.     MAGNESIUM PO Take 2 tablets by mouth daily.     Multiple Vitamins-Minerals (MENS MULTIVITAMIN PO) Take 1  tablet by mouth daily at 6 (six) AM.     ranitidine (ZANTAC) 150 MG capsule Take 150 mg by mouth daily as needed for heartburn.     No current facility-administered medications on file prior to visit.        ROS:  All others reviewed and negative.  Objective        PE:  BP 118/78   Pulse 76   Resp 18   Ht '5\' 10"'$  (1.778 m)   Wt 191 lb 6.4 oz (86.8 kg)   SpO2 98%   BMI 27.46 kg/m                 Constitutional: Pt appears in NAD               HENT: Head: NCAT.                Right Ear: External ear normal.                 Left Ear: External ear normal.                Eyes: . Pupils are equal, round, and reactive to light.  Conjunctivae and EOM are normal               Nose: without d/c or deformity               Neck: Neck supple. Gross normal ROM               Cardiovascular: Normal rate and regular rhythm.                 Pulmonary/Chest: Effort normal and breath sounds without rales or wheezing.                Abd:  Soft, NT, ND, + BS, no organomegaly               Neurological: Pt is alert. At baseline orientation, motor grossly intact               Skin: Skin is warm. No rashes, no other new lesions, LE edema - none               Psychiatric: Pt behavior is normal without agitation   Micro: none  Cardiac tracings I have personally interpreted today:  none  Pertinent Radiological findings (summarize): none   Lab Results  Component Value Date   WBC 5.0 07/14/2021   HGB 14.9 07/14/2021   HCT 44.4 07/14/2021   PLT 195.0 07/14/2021   GLUCOSE 113 (H) 07/14/2021   CHOL 278 (H) 07/14/2021   TRIG 243.0 (H) 07/14/2021   HDL 46.70 07/14/2021   LDLDIRECT 159.0 07/14/2021   LDLCALC 149 (H) 02/17/2020   ALT 11 07/14/2021   AST 12 07/14/2021   NA 139 07/14/2021   K 5.0 07/14/2021   CL 102 07/14/2021   CREATININE 0.87 07/14/2021   BUN 12 07/14/2021   CO2 32 07/14/2021   TSH 0.71 07/14/2021   PSA 0.27 07/14/2021   HGBA1C 6.7 (H) 07/14/2021   MICROALBUR <0.7 07/14/2021   Assessment/Plan:  Jonathan Watkins is a 46 y.o. Asian [4] male with  has a past medical history of Abdominal pain, left lower quadrant (10/29/2008), ALLERGIC RHINITIS (10/29/2008), Diabetes mellitus without complication ([place]), ELEVATED BLOOD PRESSURE WITHOUT DIAGNOSIS OF HYPERTENSION (10/22/2007), ERECTILE DYSFUNCTION (01/26/2008), GERD (10/22/2007), Headache(784.0) (05/02/2009), HEMATOCHEZIA (11/30/2008), HYPERLIPIDEMIA (03/21/2007), Impaired glucose tolerance (03/21/2011), NECK PAIN (12/15/2009), Other dysphagia (05/02/2009), Personal  history of colonic polyps (12/28/2008), Renal stones (03/21/2011), and SEIZURE DISORDER  (03/21/2007).  Encounter for well adult exam with abnormal findings Age and sex appropriate education and counseling updated with regular exercise and diet Referrals for preventative services - due for hep c screen, colonoscopy and plans to make yearly eye exa Immunizations addressed - declines covid booster Smoking counseling  - none needed Evidence for depression or other mood disorder - none significant Most recent labs reviewed. I have personally reviewed and have noted: 1) the patient's medical and social history 2) The patient's current medications and supplements 3) The patient's height, weight, and BMI have been recorded in the chart   Vitamin D deficiency Last vitamin D Lab Results  Component Value Date   VD25OH 22.93 (L) 07/14/2021   Low, to start oral replacement   Hyperlipidemia Lab Results  Component Value Date   LDLCALC 149 (H) 02/17/2020   Severe uncontrolled, , pt to start crestor 40 mg qd, low chol diet   HTN (hypertension) BP Readings from Last 3 Encounters:  07/14/21 118/78  12/21/20 136/82  02/17/20 120/82   Stable, pt to continue medical treatment losartan 25 mg qd   Diabetes ([place]) Lab Results  Component Value Date   HGBA1C 6.7 (H) 07/14/2021   Mild uncontrolled, pt to continue current medical treatment metformin ER 500 - 3 qam, and add rybelsus 3 mg for sugar and wt control  Followup: Return in about 6 months (around 01/13/2022).  [name], MD 07/16/2021 7:30 PM [facility]

## 2021-07-16 ENCOUNTER — Encounter: Payer: Self-pay | Admitting: Internal Medicine

## 2021-07-16 DIAGNOSIS — Z0001 Encounter for general adult medical examination with abnormal findings: Secondary | ICD-10-CM | POA: Insufficient documentation

## 2021-07-16 NOTE — Assessment & Plan Note (Addendum)
Lab Results  Component Value Date   HGBA1C 6.7 (H) 07/14/2021   Mild uncontrolled, pt to continue current medical treatment metformin ER 500 - 3 qam, and add rybelsus 3 mg for sugar and wt control

## 2021-07-16 NOTE — Assessment & Plan Note (Signed)
Age and sex appropriate education and counseling updated with regular exercise and diet Referrals for preventative services - due for hep c screen, colonoscopy and plans to make yearly eye exa Immunizations addressed - declines covid booster Smoking counseling  - none needed Evidence for depression or other mood disorder - none significant Most recent labs reviewed. I have personally reviewed and have noted: 1) the patient's medical and social history 2) The patient's current medications and supplements 3) The patient's height, weight, and BMI have been recorded in the chart

## 2021-07-16 NOTE — Assessment & Plan Note (Signed)
BP Readings from Last 3 Encounters:  07/14/21 118/78  12/21/20 136/82  02/17/20 120/82   Stable, pt to continue medical treatment losartan 25 mg qd

## 2021-07-16 NOTE — Assessment & Plan Note (Signed)
Last vitamin D Lab Results  Component Value Date   VD25OH 22.93 (L) 07/14/2021   Low, to start oral replacement

## 2021-07-16 NOTE — Assessment & Plan Note (Signed)
Lab Results  Component Value Date   LDLCALC 149 (H) 02/17/2020   Severe uncontrolled, , pt to start crestor 40 mg qd, low chol diet

## 2021-07-17 ENCOUNTER — Encounter: Payer: Self-pay | Admitting: Internal Medicine

## 2021-07-17 LAB — HEPATITIS C ANTIBODY
Hepatitis C Ab: NONREACTIVE
SIGNAL TO CUT-OFF: 0.08 (ref <1.00)

## 2021-07-18 MED ORDER — ROSUVASTATIN CALCIUM 5 MG PO TABS: 5.0000 mg | ORAL_TABLET | Freq: Every day | ORAL | 3 refills | Status: DC

## 2021-09-14 ENCOUNTER — Telehealth: Payer: Self-pay | Admitting: Internal Medicine

## 2021-09-14 DIAGNOSIS — R9431 Abnormal electrocardiogram [ECG] [EKG]: Secondary | ICD-10-CM

## 2021-09-14 DIAGNOSIS — E559 Vitamin D deficiency, unspecified: Secondary | ICD-10-CM

## 2021-09-14 DIAGNOSIS — I10 Essential (primary) hypertension: Secondary | ICD-10-CM

## 2021-09-14 DIAGNOSIS — E1165 Type 2 diabetes mellitus with hyperglycemia: Secondary | ICD-10-CM

## 2021-09-14 DIAGNOSIS — E7800 Pure hypercholesterolemia, unspecified: Secondary | ICD-10-CM

## 2021-09-14 NOTE — Telephone Encounter (Signed)
Patient would like for someone to call him to discuss calcium scoring tests.

## 2021-09-15 NOTE — Telephone Encounter (Signed)
Unable to reach patient.

## 2021-09-20 NOTE — Telephone Encounter (Signed)
Patient returned your call - please call [phone]

## 2021-09-20 NOTE — Telephone Encounter (Signed)
Ok - order done for card cT score

## 2021-09-20 NOTE — Telephone Encounter (Signed)
Patient would like order for CT Calcium Score test placed.

## 2021-10-27 ENCOUNTER — Ambulatory Visit (HOSPITAL_BASED_OUTPATIENT_CLINIC_OR_DEPARTMENT_OTHER)
Admission: RE | Admit: 2021-10-27 | Discharge: 2021-10-27 | Disposition: A | Discharge status: Home / Self Care | Payer: Managed Care, Other (non HMO) | Source: Ambulatory Visit | Attending: Internal Medicine | Admitting: Internal Medicine

## 2021-10-27 DIAGNOSIS — E1165 Type 2 diabetes mellitus with hyperglycemia: Secondary | ICD-10-CM | POA: Insufficient documentation

## 2021-10-27 DIAGNOSIS — R9431 Abnormal electrocardiogram [ECG] [EKG]: Secondary | ICD-10-CM

## 2021-10-27 DIAGNOSIS — E7800 Pure hypercholesterolemia, unspecified: Secondary | ICD-10-CM | POA: Insufficient documentation

## 2021-10-27 DIAGNOSIS — I10 Essential (primary) hypertension: Secondary | ICD-10-CM | POA: Insufficient documentation

## 2021-10-28 ENCOUNTER — Other Ambulatory Visit: Payer: Self-pay | Admitting: Internal Medicine

## 2021-10-28 DIAGNOSIS — R931 Abnormal findings on diagnostic imaging of heart and coronary circulation: Secondary | ICD-10-CM

## 2021-10-28 MED ORDER — ASPIRIN 81 MG PO TBEC: 81.0000 mg | DELAYED_RELEASE_TABLET | Freq: Every day | ORAL | 12 refills | Status: AC

## 2021-10-30 ENCOUNTER — Encounter: Payer: Self-pay | Admitting: Internal Medicine

## 2021-10-31 ENCOUNTER — Ambulatory Visit: Payer: Managed Care, Other (non HMO) | Admitting: Cardiology

## 2021-10-31 ENCOUNTER — Encounter: Payer: Self-pay | Admitting: Cardiology

## 2021-10-31 VITALS — BP 124/77 | HR 77 | Temp 98.0°F | Resp 16 | Ht 70.0 in | Wt 186.4 lb

## 2021-10-31 DIAGNOSIS — E7800 Pure hypercholesterolemia, unspecified: Secondary | ICD-10-CM

## 2021-10-31 DIAGNOSIS — R072 Precordial pain: Secondary | ICD-10-CM

## 2021-10-31 DIAGNOSIS — E781 Pure hyperglyceridemia: Secondary | ICD-10-CM

## 2021-10-31 DIAGNOSIS — R931 Abnormal findings on diagnostic imaging of heart and coronary circulation: Secondary | ICD-10-CM

## 2021-10-31 DIAGNOSIS — E119 Type 2 diabetes mellitus without complications: Secondary | ICD-10-CM

## 2021-10-31 MED ORDER — NITROGLYCERIN 0.4 MG SL SUBL: 0.4000 mg | SUBLINGUAL_TABLET | SUBLINGUAL | 0 refills | Status: DC | PRN

## 2021-10-31 MED ORDER — ROSUVASTATIN CALCIUM 40 MG PO TABS: 40.0000 mg | ORAL_TABLET | Freq: Every day | ORAL | 0 refills | Status: DC

## 2021-10-31 NOTE — Progress Notes (Signed)
ID:  Jonathan Watkins, DOB 02/05/1976, MRN [id number]  PCP:  Biagio Borg, MD  Cardiologist:  Rex Kras, DO, [facility] (established care 10/31/2021)  REASON FOR CONSULT: Elevated coronary artery calcium score  REQUESTING PHYSICIAN:  Biagio Borg, MD [address] [place],  [place] [address]  Chief Complaint  Patient presents with   Elevated coronary artery calcium score   New Patient (Initial Visit)    HPI  Jonathan Watkins is a 46 y.o. [place] descent male who presents to the clinic for evaluation of elevated CAC at the request of Biagio Borg, MD. His past medical history and cardiovascular risk factors include: Non-insulin-dependent diabetes mellitus type 2, hyperlipidemia, hypertriglyceridemia, severe coronary artery calcium score (total CAC 686, 99th percentile).   Patient recently had a coronary calcium score as a prophylactic measure to look at his cardiovascular health and was noted to have severe coronary artery calcification placing him at the 99th percentile.  He now presents to cardiology for further evaluation and management.  He has been having precordial discomfort for the last 2 to 3 years.  Usually during the morning hours for the early afternoon he has been associating it with poor sleep hygiene.  The discomfort is over the left pectoral region, pressure-like feeling, intensity 4 out of 10, lasting for 2 to 3 hours, nonradiating, not brought on by effort related activities.  Overall intensity frequency and duration has not changed significantly over the last 3 years.  Since 2014 he was noted to be prediabetic and as of June 2023 his A1c is in the diabetic range at 6.7.  He is currently on metformin and low-dose Crestor.  He has started exercising 2 or 3 days ago with plans of using the treadmill at least 20 minutes every day.  He still very active at home with daily chores and maintaining his house.  No family history of premature coronary artery disease or sudden cardiac  death.  ALLERGIES: Allergies  Allergen Reactions   Percocet [Oxycodone-Acetaminophen] Other (See Comments)    Constipation     MEDICATION LIST PRIOR TO VISIT: Current Meds  Medication Sig   aspirin EC 81 MG tablet Take 1 tablet (81 mg total) by mouth daily. Swallow whole.   clindamycin (CLEOCIN T) 1 % SWAB    cyclobenzaprine (FLEXERIL) 10 MG tablet Take 1 tablet (10 mg total) by mouth at bedtime.   fish oil-omega-3 fatty acids 1000 MG capsule Take 4 g by mouth daily.   MAGNESIUM PO Take 2 tablets by mouth daily.   metFORMIN (GLUCOPHAGE-XR) 500 MG 24 hr tablet TAKE 3 TABLETS BY MOUTH DAILY WITH BREAKFAST   Multiple Vitamins-Minerals (MENS MULTIVITAMIN PO) Take 1 tablet by mouth daily at 6 (six) AM.   nitroGLYCERIN (NITROSTAT) 0.4 MG SL tablet Place 1 tablet (0.4 mg total) under the tongue every 5 (five) minutes as needed for chest pain. If you require more than two tablets five minutes apart go to the nearest ER via EMS.   ranitidine (ZANTAC) 150 MG capsule Take 150 mg by mouth daily as needed for heartburn.   rosuvastatin (CRESTOR) 40 MG tablet Take 1 tablet (40 mg total) by mouth at bedtime.   [DISCONTINUED] rosuvastatin (CRESTOR) 5 MG tablet Take 1 tablet (5 mg total) by mouth daily.     PAST MEDICAL HISTORY: Past Medical History:  Diagnosis Date   Abdominal pain, left lower quadrant 10/29/2008   ALLERGIC RHINITIS 10/29/2008   Calcification of native coronary artery    Diabetes mellitus  without complication ([place])    ELEVATED BLOOD PRESSURE WITHOUT DIAGNOSIS OF HYPERTENSION 10/22/2007   ERECTILE DYSFUNCTION 01/26/2008   GERD 10/22/2007   Headache(784.0) 05/02/2009   HEMATOCHEZIA 11/30/2008   HYPERLIPIDEMIA 03/21/2007   on meds   Impaired glucose tolerance 03/21/2011   NECK PAIN 12/15/2009   Other dysphagia 05/02/2009   Personal history of colonic polyps 12/28/2008   Renal stones 03/21/2011   SEIZURE DISORDER 03/21/2007    PAST SURGICAL HISTORY: Past Surgical History:   Procedure Laterality Date   ANAL FISSURE REPAIR     COLONOSCOPY  12/2008   RK-F/V-moviprep(exc)-hx of polyps-normal   NASAL FRACTURE SURGERY     nasal fracture sx repair   WISDOM TOOTH EXTRACTION      FAMILY HISTORY: The patient family history includes Diabetes in his brother, father, and mother; Hyperlipidemia in his brother, father, and mother; Hypertension in his brother; Stroke in his father.  SOCIAL HISTORY:  The patient  reports that he has never smoked. He has never used smokeless tobacco. He reports current alcohol use. He reports that he does not use drugs.  REVIEW OF SYSTEMS: Review of Systems  Cardiovascular:  Positive for chest pain (See above). Negative for claudication, dyspnea on exertion, irregular heartbeat, leg swelling, near-syncope, orthopnea, palpitations, paroxysmal nocturnal dyspnea and syncope.  Respiratory:  Negative for shortness of breath.   Hematologic/Lymphatic: Negative for bleeding problem.  Musculoskeletal:  Negative for muscle cramps and myalgias.  Neurological:  Negative for dizziness and light-headedness.    PHYSICAL EXAM:    10/31/2021   11:28 AM 07/14/2021   10:32 AM 12/21/2020    3:37 PM  Vitals with BMI  Height 5' 10"  5' 10"  5' 10"   Weight 186 lbs 6 oz 191 lbs 6 oz 193 lbs  BMI 26.75 64.68 03.21  Systolic 224 825 003  Diastolic 77 78 82  Pulse 77 76 77    Physical Exam  Constitutional: No distress.  Age appropriate, hemodynamically stable.   Neck: No JVD present.  Cardiovascular: Normal rate, regular rhythm, S1 normal, S2 normal, intact distal pulses and normal pulses. Exam reveals no gallop, no S3 and no S4.  No murmur heard. Pulses:      Carotid pulses are 2+ on the right side and 2+ on the left side.      Radial pulses are 2+ on the right side and 2+ on the left side.       Dorsalis pedis pulses are 2+ on the right side and 2+ on the left side.       Posterior tibial pulses are 2+ on the right side and 2+ on the left side.   Pulmonary/Chest: Effort normal and breath sounds normal. No stridor. He has no wheezes. He has no rales.  Abdominal: Soft. Bowel sounds are normal. He exhibits no distension. There is no abdominal tenderness.  Musculoskeletal:        General: No edema.     Cervical back: Neck supple.  Neurological: He is alert and oriented to person, place, and time. He has intact cranial nerves (2-12).  Skin: Skin is warm and moist.   CARDIAC DATABASE: EKG: 10/31/2021: Normal sinus rhythm, 77 bpm, without underlying ischemia or injury pattern.  Echocardiogram: No results found for this or any previous visit from the past 1095 days.    Stress Testing: No results found for this or any previous visit from the past 1095 days.   Heart Catheterization: None  Coronary artery calcium score 10/27/2021 Left main: 0 Left anterior  descending artery: 300 Left circumflex artery: 134 Right coronary artery: 252   Total: 686 Percentile: 99th when compared to Caucasian age and sex matched peers.  LABORATORY DATA:    Latest Ref Rng & Units 07/14/2021   11:42 AM 02/25/2019    1:53 PM 11/08/2016    7:40 AM  CBC  WBC 4.0 - 10.5 K/uL 5.0  4.9  5.2   Hemoglobin 13.0 - 17.0 g/dL 14.9  15.3  14.9   Hematocrit 39.0 - 52.0 % 44.4  46.3  45.1   Platelets 150.0 - 400.0 K/uL 195.0  227.0  206.0 Repeated and verified X2.        Latest Ref Rng & Units 07/14/2021   11:42 AM 02/17/2020    9:11 AM 02/25/2019    1:53 PM  CMP  Glucose 70 - 99 mg/dL 113  123  134   BUN 6 - 23 mg/dL 12  20  11    Creatinine 0.40 - 1.50 mg/dL 0.87  0.92  0.85   Sodium 135 - 145 mEq/L 139  138  137   Potassium 3.5 - 5.1 mEq/L 5.0  4.4  3.9   Chloride 96 - 112 mEq/L 102  102  101   CO2 19 - 32 mEq/L 32  30  28   Calcium 8.4 - 10.5 mg/dL 10.1  9.6  9.7   Total Protein 6.0 - 8.3 g/dL 7.5  7.6  7.7   Total Bilirubin 0.2 - 1.2 mg/dL 0.6  0.6  0.8   Alkaline Phos 39 - 117 U/L 43  49  46   AST 0 - 37 U/L 12  17  15    ALT 0 - 53 U/L 11  24  16       Lipid Panel  Lab Results  Component Value Date   CHOL 278 (H) 07/14/2021   HDL 46.70 07/14/2021   LDLCALC 149 (H) 02/17/2020   LDLDIRECT 159.0 07/14/2021   TRIG 243.0 (H) 07/14/2021   CHOLHDL 6 07/14/2021    No components found for: "NTPROBNP" No results for input(s): "PROBNP" in the last 8760 hours. Recent Labs    07/14/21 1142  TSH 0.71    BMP Recent Labs    07/14/21 1142  NA 139  K 5.0  CL 102  CO2 32  GLUCOSE 113*  BUN 12  CREATININE 0.87  CALCIUM 10.1    HEMOGLOBIN A1C Lab Results  Component Value Date   HGBA1C 6.7 (H) 07/14/2021    IMPRESSION:    ICD-10-CM   1. Elevated coronary artery calcium score  R93.1 EKG 12-Lead    Lipid Panel With LDL/HDL Ratio    LDL cholesterol, direct    CMP14+EGFR    PCV MYOCARDIAL PERFUSION WO LEXISCAN    rosuvastatin (CRESTOR) 40 MG tablet    nitroGLYCERIN (NITROSTAT) 0.4 MG SL tablet    2. Agatston CAC score, >400  R93.1 Lipid Panel With LDL/HDL Ratio    LDL cholesterol, direct    CMP14+EGFR    PCV MYOCARDIAL PERFUSION WO LEXISCAN    rosuvastatin (CRESTOR) 40 MG tablet    3. Precordial pain  R07.2 rosuvastatin (CRESTOR) 40 MG tablet    nitroGLYCERIN (NITROSTAT) 0.4 MG SL tablet    4. Non-insulin dependent type 2 diabetes mellitus ([place])  E11.9     5. Pure hypercholesterolemia  E78.00     6. Hypertriglyceridemia  E78.1        RECOMMENDATIONS: Sidharth Leverette is a 46 y.o. [place] descent male whose past medical history  and cardiac risk factors include: Non-insulin-dependent diabetes mellitus type 2, hyperlipidemia, hypertriglyceridemia, severe coronary artery calcium score (total CAC 686, 99th percentile).   Elevated coronary artery calcium score / Agatston CAC score, >400 Total CAC 686, at the 99th percentile if compared to male Caucasian cohorts. Continue aspirin 81 mg p.o. daily While being on Crestor 5 mg p.o. nightly LDL is 149 mg/dL Given three-vessel coronary artery calcification, severe CAC,  diabetes, recommend a goal LDL of less than 70 mg/dL and if possible closer to 55 mg/dL. We will discontinue Crestor 5 mg p.o. nightly and transition him to 40 mg p.o. nightly for now until his lipids are well controlled.  And once his lipids are within acceptable limits will down titrate his Crestor dose to monitor his lipids longitudinally. With regards to ischemic evaluation we discussed the risks, benefits, limitations of both coronary CTA versus exercise nuclear stress test.  This shared decision was to proceed with exercise nuclear stress test to evaluate his functional capacity, EKG response to stress, and reversible ischemia.  Looking at his coronary calcium score images and the probability overestimating disease burden secondary to blooming artifact is high on a conventional coronary CTA.  Precordial pain Appears to be noncardiac. However given his long history of prediabetes and diabetes cannot rule out ischemic equivalents/burden. In addition, he has three-vessel severe coronary artery calcification, uncontrolled hyperlipidemia and hypertriglyceridemia. EKG: Nonischemic Ischemic work-up as outlined above. Echo will be ordered to evaluate for structural heart disease and left ventricular systolic function.  Non-insulin dependent type 2 diabetes mellitus (HCC) Hemoglobin A1c is within acceptable limits. We will continue metformin. Blood pressures are well controlled; however, will still recommend low-dose ARB for renal protection (I will defer this to PCP).  LDL goals as discussed above.  Pure hypercholesterolemia Uptitrate Crestor to 40 mg p.o. nightly. Labs in 6 weeks to reevaluate lipids and LFTs  Hypertriglyceridemia See above  For now I have asked him to continue leisure walks with his wife until ischemic work-up is complete.  I would refrain from high intensity activity given his multiple cardiovascular risk factors and precordial discomfort.  In addition I prescribed a  sublingual nitroglycerin tablets to use if he has symptoms of anginal discomfort or if his current symptoms increase in intensity, frequency, and/or duration.  Further recommendations to follow.  Data Reviewed: I have independently reviewed external notes provided by the referring provider as part of this office visit.   I have independently reviewed coronary calcium score report as well as images, EKG, labs as part of medical decision making. I have ordered the following tests:  Orders Placed This Encounter  Procedures   Lipid Panel With LDL/HDL Ratio    Standing Status:   Future    Standing Expiration Date:   11/01/2022   LDL cholesterol, direct    Standing Status:   Future    Standing Expiration Date:   11/01/2022   CMP14+EGFR    Standing Status:   Future    Standing Expiration Date:   11/01/2022   PCV MYOCARDIAL PERFUSION WO LEXISCAN    Standing Status:   Future    Standing Expiration Date:   11/01/2022   EKG 12-Lead   PCV ECHOCARDIOGRAM COMPLETE    Standing Status:   Future    Standing Expiration Date:   11/01/2022   I have made medications changes at today's encounter as noted above.  FINAL MEDICATION LIST END OF ENCOUNTER: Meds ordered this encounter  Medications   rosuvastatin (CRESTOR) 40 MG  tablet    Sig: Take 1 tablet (40 mg total) by mouth at bedtime.    Dispense:  90 tablet    Refill:  0   nitroGLYCERIN (NITROSTAT) 0.4 MG SL tablet    Sig: Place 1 tablet (0.4 mg total) under the tongue every 5 (five) minutes as needed for chest pain. If you require more than two tablets five minutes apart go to the nearest ER via EMS.    Dispense:  30 tablet    Refill:  0    Medications Discontinued During This Encounter  Medication Reason   Semaglutide (RYBELSUS) 3 MG TABS    rosuvastatin (CRESTOR) 5 MG tablet Dose change     Current Outpatient Medications:    aspirin EC 81 MG tablet, Take 1 tablet (81 mg total) by mouth daily. Swallow whole., Disp: 30 tablet, Rfl: 12    clindamycin (CLEOCIN T) 1 % SWAB, , Disp: , Rfl:    cyclobenzaprine (FLEXERIL) 10 MG tablet, Take 1 tablet (10 mg total) by mouth at bedtime., Disp: 30 tablet, Rfl: 0   fish oil-omega-3 fatty acids 1000 MG capsule, Take 4 g by mouth daily., Disp: , Rfl:    MAGNESIUM PO, Take 2 tablets by mouth daily., Disp: , Rfl:    metFORMIN (GLUCOPHAGE-XR) 500 MG 24 hr tablet, TAKE 3 TABLETS BY MOUTH DAILY WITH BREAKFAST, Disp: 270 tablet, Rfl: 3   Multiple Vitamins-Minerals (MENS MULTIVITAMIN PO), Take 1 tablet by mouth daily at 6 (six) AM., Disp: , Rfl:    nitroGLYCERIN (NITROSTAT) 0.4 MG SL tablet, Place 1 tablet (0.4 mg total) under the tongue every 5 (five) minutes as needed for chest pain. If you require more than two tablets five minutes apart go to the nearest ER via EMS., Disp: 30 tablet, Rfl: 0   ranitidine (ZANTAC) 150 MG capsule, Take 150 mg by mouth daily as needed for heartburn., Disp: , Rfl:    rosuvastatin (CRESTOR) 40 MG tablet, Take 1 tablet (40 mg total) by mouth at bedtime., Disp: 90 tablet, Rfl: 0  Orders Placed This Encounter  Procedures   Lipid Panel With LDL/HDL Ratio   LDL cholesterol, direct   CMP14+EGFR   PCV MYOCARDIAL PERFUSION WO LEXISCAN   EKG 12-Lead    There are no Patient Instructions on file for this visit.   --Continue cardiac medications as reconciled in final medication list. --Return in about 7 weeks (around 12/19/2021) for Reevaluation of, Chest pain, Review test results. or sooner if needed. --Continue follow-up with your primary care physician regarding the management of your other chronic comorbid conditions.  Patient's questions and concerns were addressed to his satisfaction. He voices understanding of the instructions provided during this encounter.   This note was created using a voice recognition software as a result there may be grammatical errors inadvertently enclosed that do not reflect the nature of this encounter. Every attempt is made to correct such  errors.  Rex Kras, [place], [facility]  Pager: [phone] Office: [phone]

## 2021-11-03 ENCOUNTER — Ambulatory Visit: Payer: Managed Care, Other (non HMO)

## 2021-11-03 DIAGNOSIS — R931 Abnormal findings on diagnostic imaging of heart and coronary circulation: Secondary | ICD-10-CM

## 2021-11-27 ENCOUNTER — Other Ambulatory Visit: Payer: Self-pay | Admitting: Cardiology

## 2021-11-27 DIAGNOSIS — R931 Abnormal findings on diagnostic imaging of heart and coronary circulation: Secondary | ICD-10-CM

## 2021-11-27 DIAGNOSIS — R072 Precordial pain: Secondary | ICD-10-CM

## 2021-12-19 ENCOUNTER — Other Ambulatory Visit: Payer: Managed Care, Other (non HMO)

## 2021-12-22 ENCOUNTER — Ambulatory Visit: Payer: Managed Care, Other (non HMO) | Admitting: Cardiology

## 2022-01-19 ENCOUNTER — Ambulatory Visit: Payer: Managed Care, Other (non HMO)

## 2022-01-19 DIAGNOSIS — R072 Precordial pain: Secondary | ICD-10-CM

## 2022-01-23 ENCOUNTER — Other Ambulatory Visit: Payer: Managed Care, Other (non HMO)

## 2022-01-26 ENCOUNTER — Ambulatory Visit: Payer: Managed Care, Other (non HMO) | Admitting: Internal Medicine

## 2022-01-26 ENCOUNTER — Ambulatory Visit (INDEPENDENT_AMBULATORY_CARE_PROVIDER_SITE_OTHER): Payer: Managed Care, Other (non HMO)

## 2022-01-26 ENCOUNTER — Other Ambulatory Visit: Payer: Self-pay | Admitting: Internal Medicine

## 2022-01-26 ENCOUNTER — Ambulatory Visit: Payer: Managed Care, Other (non HMO) | Admitting: Cardiology

## 2022-01-26 ENCOUNTER — Encounter: Payer: Self-pay | Admitting: Cardiology

## 2022-01-26 VITALS — BP 124/76 | HR 83 | Temp 98.1°F | Ht 70.0 in | Wt 181.0 lb

## 2022-01-26 VITALS — BP 130/84 | HR 90 | Ht 70.0 in | Wt 181.2 lb

## 2022-01-26 DIAGNOSIS — Z1211 Encounter for screening for malignant neoplasm of colon: Secondary | ICD-10-CM

## 2022-01-26 DIAGNOSIS — R634 Abnormal weight loss: Secondary | ICD-10-CM

## 2022-01-26 DIAGNOSIS — E7800 Pure hypercholesterolemia, unspecified: Secondary | ICD-10-CM

## 2022-01-26 DIAGNOSIS — R931 Abnormal findings on diagnostic imaging of heart and coronary circulation: Secondary | ICD-10-CM

## 2022-01-26 DIAGNOSIS — E1165 Type 2 diabetes mellitus with hyperglycemia: Secondary | ICD-10-CM | POA: Diagnosis not present

## 2022-01-26 DIAGNOSIS — E559 Vitamin D deficiency, unspecified: Secondary | ICD-10-CM

## 2022-01-26 DIAGNOSIS — E119 Type 2 diabetes mellitus without complications: Secondary | ICD-10-CM

## 2022-01-26 DIAGNOSIS — E538 Deficiency of other specified B group vitamins: Secondary | ICD-10-CM | POA: Diagnosis not present

## 2022-01-26 DIAGNOSIS — M7711 Lateral epicondylitis, right elbow: Secondary | ICD-10-CM | POA: Diagnosis not present

## 2022-01-26 DIAGNOSIS — Z125 Encounter for screening for malignant neoplasm of prostate: Secondary | ICD-10-CM | POA: Diagnosis not present

## 2022-01-26 DIAGNOSIS — E781 Pure hyperglyceridemia: Secondary | ICD-10-CM

## 2022-01-26 DIAGNOSIS — R3129 Other microscopic hematuria: Secondary | ICD-10-CM

## 2022-01-26 LAB — PSA: PSA: 0.3 ng/mL (ref 0.10–4.00)

## 2022-01-26 LAB — CBC WITH DIFFERENTIAL/PLATELET
Basophils Absolute: 0 10*3/uL (ref 0.0–0.1)
Basophils Relative: 0.8 % (ref 0.0–3.0)
Eosinophils Absolute: 0.2 10*3/uL (ref 0.0–0.7)
Eosinophils Relative: 3.8 % (ref 0.0–5.0)
HCT: 44.2 % (ref 39.0–52.0)
Hemoglobin: 14.7 g/dL (ref 13.0–17.0)
Lymphocytes Relative: 31.1 % (ref 12.0–46.0)
Lymphs Abs: 1.9 10*3/uL (ref 0.7–4.0)
MCHC: 33.2 g/dL (ref 30.0–36.0)
MCV: 88.6 fl (ref 78.0–100.0)
Monocytes Absolute: 0.3 10*3/uL (ref 0.1–1.0)
Monocytes Relative: 4.7 % (ref 3.0–12.0)
Neutro Abs: 3.6 10*3/uL (ref 1.4–7.7)
Neutrophils Relative %: 59.6 % (ref 43.0–77.0)
Platelets: 212 10*3/uL (ref 150.0–400.0)
RBC: 5 Mil/uL (ref 4.22–5.81)
RDW: 14 % (ref 11.5–15.5)
WBC: 6 10*3/uL (ref 4.0–10.5)

## 2022-01-26 LAB — LIPID PANEL
Cholesterol: 114 mg/dL (ref 0–200)
HDL: 49.2 mg/dL (ref >39.00)
LDL Cholesterol: 51 mg/dL (ref 0–99)
NonHDL: 64.9
Total CHOL/HDL Ratio: 2
Triglycerides: 69 mg/dL (ref 0.0–149.0)
VLDL: 13.8 mg/dL (ref 0.0–40.0)

## 2022-01-26 LAB — HEPATIC FUNCTION PANEL
ALT: 15 U/L (ref 0–53)
AST: 14 U/L (ref 0–37)
Albumin: 5 g/dL (ref 3.5–5.2)
Alkaline Phosphatase: 43 U/L (ref 39–117)
Bilirubin, Direct: 0.2 mg/dL (ref 0.0–0.3)
Total Bilirubin: 1.1 mg/dL (ref 0.2–1.2)
Total Protein: 7.5 g/dL (ref 6.0–8.3)

## 2022-01-26 LAB — URINALYSIS, ROUTINE W REFLEX MICROSCOPIC
Bilirubin Urine: NEGATIVE
Ketones, ur: NEGATIVE
Leukocytes,Ua: NEGATIVE
Nitrite: NEGATIVE
Specific Gravity, Urine: >=1.03 — AB (ref 1.000–1.030)
Urine Glucose: NEGATIVE
Urobilinogen, UA: 0.2 (ref 0.0–1.0)
pH: 5.5 (ref 5.0–8.0)

## 2022-01-26 LAB — MICROALBUMIN / CREATININE URINE RATIO
Creatinine,U: 148.1 mg/dL
Microalb Creat Ratio: 2.1 mg/g (ref 0.0–30.0)
Microalb, Ur: 3.1 mg/dL — ABNORMAL HIGH (ref 0.0–1.9)

## 2022-01-26 LAB — POCT GLYCOSYLATED HEMOGLOBIN (HGB A1C): Hemoglobin A1C: 5.9 % — AB (ref 4.0–5.6)

## 2022-01-26 LAB — VITAMIN B12: Vitamin B-12: 561 pg/mL (ref 211–911)

## 2022-01-26 LAB — BASIC METABOLIC PANEL
BUN: 15 mg/dL (ref 6–23)
CO2: 30 mEq/L (ref 19–32)
Calcium: 9.9 mg/dL (ref 8.4–10.5)
Chloride: 102 mEq/L (ref 96–112)
Creatinine, Ser: 0.81 mg/dL (ref 0.40–1.50)
GFR: 105.41 mL/min (ref >60.00)
Glucose, Bld: 112 mg/dL — ABNORMAL HIGH (ref 70–99)
Potassium: 4.5 mEq/L (ref 3.5–5.1)
Sodium: 139 mEq/L (ref 135–145)

## 2022-01-26 LAB — TSH: TSH: 0.59 u[IU]/mL (ref 0.35–5.50)

## 2022-01-26 LAB — SEDIMENTATION RATE: Sed Rate: 12 mm/hr (ref 0–15)

## 2022-01-26 LAB — VITAMIN D 25 HYDROXY (VIT D DEFICIENCY, FRACTURES): VITD: 83.09 ng/mL (ref 30.00–100.00)

## 2022-01-26 LAB — HEMOGLOBIN A1C: Hgb A1c MFr Bld: 6.5 % (ref 4.6–6.5)

## 2022-01-26 MED ORDER — METFORMIN HCL ER 500 MG PO TB24: ORAL_TABLET | ORAL | 3 refills | Status: DC

## 2022-01-26 NOTE — Progress Notes (Signed)
ID:  Jonathan Watkins, DOB [birth year], MRN [id number]  PCP:  Biagio Borg, MD  Cardiologist:  Rex Kras, DO, [facility] (established care 10/31/2021)  Date: 01/26/22 Last Office Visit: 10/31/2021  Chief Complaint  Patient presents with   Follow-up    3 month follow up  CAC and review stress test.     HPI  Jonathan Watkins is a 46 y.o. [place] descent male who presents to the clinic for evaluation of elevated CAC at the request of Biagio Borg, MD. His past medical history and cardiovascular risk factors include: Non-insulin-dependent diabetes mellitus type 2, hyperlipidemia, hypertriglyceridemia, severe coronary artery calcium score (total CAC 686, 99th percentile).   Patient was referred to the practice for evaluation and management of severe coronary artery calcification placing him at the 99th percentile and nonspecific precordial discomfort ongoing for the last 3 years.  At the last office visit the shared decision was to proceed with exercise nuclear stress test which was reported to be low risk.  He has started increasing his physical activity and currently walks/jogs 2.5 miles 4-5 times per week.  He does not have any exertional chest pain or shortness of breath.  The increasing physical activity has facilitated weight loss.  He had an appointment with his PCP earlier today and his hemoglobin A1c has come down to 5.9 compared to 6.7 in June 2023.  At the last office visit his dose of Crestor was increased from 5 mg p.o. nightly to 40 mg p.o. nightly to help improve his lipid profile.  However due to constipation the dose was reduced to 20 mg p.o. nightly.  He did well on the medication without any myalgias with the exception of back pain which started 2 weeks ago.  He plans to see [facility] for further evaluation and management.  No family history of premature coronary artery disease or sudden cardiac death.   ALLERGIES: Allergies  Allergen Reactions   Percocet [Oxycodone-Acetaminophen]  Other (See Comments)    Constipation     MEDICATION LIST PRIOR TO VISIT: Current Meds  Medication Sig   aspirin EC 81 MG tablet Take 1 tablet (81 mg total) by mouth daily. Swallow whole.   Cholecalciferol (D3 2000 PO) Take 1 capsule by mouth daily. Coconut oil, K1 and D3   clindamycin (CLEOCIN T) 1 % SWAB    cyclobenzaprine (FLEXERIL) 10 MG tablet Take 1 tablet (10 mg total) by mouth at bedtime.   fish oil-omega-3 fatty acids 1000 MG capsule Take 4 g by mouth daily.   MAGNESIUM PO Take 2 tablets by mouth daily.   metFORMIN (GLUCOPHAGE-XR) 500 MG 24 hr tablet TAKE 1 TABLETS BY MOUTH DAILY WITH BREAKFAST   Multiple Vitamins-Minerals (MENS MULTIVITAMIN PO) Take 1 tablet by mouth daily at 6 (six) AM.   nitroGLYCERIN (NITROSTAT) 0.4 MG SL tablet DISSOLVE 1 TABLET UNDER TONGUE FOR CHEST PAIN - IF PAIN REMAINS AFTER 5 MIN, CALL 911 AND REPEAT DOSE. MAX 3 TABLETS IN 15 MINUTES   ranitidine (ZANTAC) 150 MG capsule Take 150 mg by mouth daily as needed for heartburn.   rosuvastatin (CRESTOR) 40 MG tablet Take 1 tablet (40 mg total) by mouth at bedtime.     PAST MEDICAL HISTORY: Past Medical History:  Diagnosis Date   Abdominal pain, left lower quadrant 10/29/2008   ALLERGIC RHINITIS 10/29/2008   Calcification of native coronary artery    Diabetes mellitus without complication ([place])    ELEVATED BLOOD PRESSURE WITHOUT DIAGNOSIS OF HYPERTENSION 10/22/2007   ERECTILE DYSFUNCTION  01/26/2008   GERD 10/22/2007   Headache(784.0) 05/02/2009   HEMATOCHEZIA 11/30/2008   HYPERLIPIDEMIA 03/21/2007   on meds   Impaired glucose tolerance 03/21/2011   NECK PAIN 12/15/2009   Other dysphagia 05/02/2009   Personal history of colonic polyps 12/28/2008   Renal stones 03/21/2011   SEIZURE DISORDER 03/21/2007    PAST SURGICAL HISTORY: Past Surgical History:  Procedure Laterality Date   ANAL FISSURE REPAIR     COLONOSCOPY  12/2008   RK-F/V-moviprep(exc)-hx of polyps-normal   NASAL FRACTURE SURGERY      nasal fracture sx repair   WISDOM TOOTH EXTRACTION      FAMILY HISTORY: The patient family history includes Diabetes in his brother, father, and mother; Hyperlipidemia in his brother, father, and mother; Hypertension in his brother; Stroke in his father.  SOCIAL HISTORY:  The patient  reports that he has never smoked. He has never used smokeless tobacco. He reports current alcohol use. He reports that he does not use drugs.  REVIEW OF SYSTEMS: Review of Systems  Constitutional: Positive for weight loss.  Cardiovascular:  Negative for chest pain, claudication, dyspnea on exertion, irregular heartbeat, leg swelling, near-syncope, orthopnea, palpitations, paroxysmal nocturnal dyspnea and syncope.  Respiratory:  Negative for shortness of breath.   Hematologic/Lymphatic: Negative for bleeding problem.  Musculoskeletal:  Negative for muscle cramps and myalgias.  Neurological:  Negative for dizziness and light-headedness.    PHYSICAL EXAM:    01/26/2022   11:51 AM 01/26/2022    8:56 AM 10/31/2021   11:28 AM  Vitals with BMI  Height '5\' 10"'$  '5\' 10"'$  '5\' 10"'$   Weight 181 lbs 3 oz 181 lbs 186 lbs 6 oz  BMI 26 73.71 06.26  Systolic 948 546 270  Diastolic 84 76 77  Pulse 90 83 77    Physical Exam  Constitutional: No distress.  Age appropriate, hemodynamically stable.   Neck: No JVD present.  Cardiovascular: Normal rate, regular rhythm, S1 normal, S2 normal, intact distal pulses and normal pulses. Exam reveals no gallop, no S3 and no S4.  No murmur heard. Pulses:      Carotid pulses are 2+ on the right side and 2+ on the left side.      Radial pulses are 2+ on the right side and 2+ on the left side.       Dorsalis pedis pulses are 2+ on the right side and 2+ on the left side.       Posterior tibial pulses are 2+ on the right side and 2+ on the left side.  Pulmonary/Chest: Effort normal and breath sounds normal. No stridor. He has no wheezes. He has no rales.  Abdominal: Soft. Bowel sounds  are normal. He exhibits no distension. There is no abdominal tenderness.  Musculoskeletal:        General: No edema.     Cervical back: Neck supple.  Neurological: He is alert and oriented to person, place, and time. He has intact cranial nerves (2-12).  Skin: Skin is warm and moist.   CARDIAC DATABASE: EKG: 10/31/2021: Normal sinus rhythm, 77 bpm, without underlying ischemia or injury pattern.  Echocardiogram: 01/19/2022:  Normal LV systolic function with visual EF 60-65%. Left ventricle cavity is normal in size. Normal left ventricular wall thickness. Normal global wall motion. Normal diastolic filling pattern, normal LAP.  No significant valvular heart disease.  No prior study for comparison.    Stress Testing: Exercise Tetrofosmin stress test 11/03/2021: Exercise nuclear stress test was performed using Bruce protocol. Patient reached  9.5 METS, and 87% of age predicted maximum heart rate. Exercise capacity was good. No chest pain reported. Heart rate and hemodynamic response were normal. Stress EKG revealed no ischemic changes. Normal myocardial perfusion. Stress LVEF 54%. Low risk study.   Heart Catheterization: None  Coronary artery calcium score 10/27/2021 Left main: 0 Left anterior descending artery: 300 Left circumflex artery: 134 Right coronary artery: 252   Total: 686 Percentile: 99th when compared to Caucasian age and sex matched peers.  LABORATORY DATA:    Latest Ref Rng & Units 07/14/2021   11:42 AM 02/25/2019    1:53 PM 11/08/2016    7:40 AM  CBC  WBC 4.0 - 10.5 K/uL 5.0  4.9  5.2   Hemoglobin 13.0 - 17.0 g/dL 14.9  15.3  14.9   Hematocrit 39.0 - 52.0 % 44.4  46.3  45.1   Platelets 150.0 - 400.0 K/uL 195.0  227.0  206.0 Repeated and verified X2.        Latest Ref Rng & Units 07/14/2021   11:42 AM 02/17/2020    9:11 AM 02/25/2019    1:53 PM  CMP  Glucose 70 - 99 mg/dL 113  123  134   BUN 6 - 23 mg/dL '12  20  11   '$ Creatinine 0.40 - 1.50 mg/dL 0.87  0.92  0.85    Sodium 135 - 145 mEq/L 139  138  137   Potassium 3.5 - 5.1 mEq/L 5.0  4.4  3.9   Chloride 96 - 112 mEq/L 102  102  101   CO2 19 - 32 mEq/L 32  30  28   Calcium 8.4 - 10.5 mg/dL 10.1  9.6  9.7   Total Protein 6.0 - 8.3 g/dL 7.5  7.6  7.7   Total Bilirubin 0.2 - 1.2 mg/dL 0.6  0.6  0.8   Alkaline Phos 39 - 117 U/L 43  49  46   AST 0 - 37 U/L '12  17  15   '$ ALT 0 - 53 U/L '11  24  16     '$ Lipid Panel  Lab Results  Component Value Date   CHOL 278 (H) 07/14/2021   HDL 46.70 07/14/2021   LDLCALC 149 (H) 02/17/2020   LDLDIRECT 159.0 07/14/2021   TRIG 243.0 (H) 07/14/2021   CHOLHDL 6 07/14/2021    No components found for: "NTPROBNP" No results for input(s): "PROBNP" in the last 8760 hours. Recent Labs    07/14/21 1142  TSH 0.71    BMP Recent Labs    07/14/21 1142  NA 139  K 5.0  CL 102  CO2 32  GLUCOSE 113*  BUN 12  CREATININE 0.87  CALCIUM 10.1    HEMOGLOBIN A1C Lab Results  Component Value Date   HGBA1C 5.9 (A) 01/26/2022    IMPRESSION:    ICD-10-CM   1. Elevated coronary artery calcium score  R93.1     2. Agatston CAC score, >400  R93.1     3. Non-insulin dependent type 2 diabetes mellitus ([place])  E11.9     4. Pure hypercholesterolemia  E78.00     5. Hypertriglyceridemia  E78.1        RECOMMENDATIONS: Collen Vincent is a 46 y.o. [place] descent male whose past medical history and cardiac risk factors include: Non-insulin-dependent diabetes mellitus type 2, hyperlipidemia, hypertriglyceridemia, severe coronary artery calcium score (total CAC 686, 99th percentile).   Elevated coronary artery calcium score / Agatston CAC score, >400 Total CAC 686, at the 99th  percentile if compared to male Caucasian cohorts. Continue aspirin and statin therapy. He had labs with PCP earlier today we will await the results. If LDL levels are well-controlled recommend reducing Crestor to 10 mg p.o. nightly until follow-up lipid profile 6 months later.  Ideally given his young  age would recommend the lowest dose of statin as needed to keep acceptable LDL levels. Echo: Preserved LVEF, normal diastolic function, no significant valvular heart disease. Exercise nuclear stress test: Low risk study.  Results independently reviewed with the patient including images at today's visit. We emphasized the importance of improving his modifiable cardiovascular risk factors. Continue moderate intensity exercise as tolerated with a goal of 30 minutes a day 5 days a week.  Non-insulin dependent type 2 diabetes mellitus (HCC) Hemoglobin A1c in June 2023 6.3. Hemoglobin A1c as of today 5.9 Currently on metformin, statin therapy, and ARB. Patient does not recall the dose of therapy which was recommended at the last office visit for renal protection he will call us back to update his MAR  Pure hypercholesterolemia Currently on statin therapy. Lipids checked earlier this morning, results pending Denies myalgias as of now. See above  Hypertriglyceridemia Repeat lipid profile pending See above  FINAL MEDICATION LIST END OF ENCOUNTER: No orders of the defined types were placed in this encounter.   There are no discontinued medications.    Current Outpatient Medications:    aspirin EC 81 MG tablet, Take 1 tablet (81 mg total) by mouth daily. Swallow whole., Disp: 30 tablet, Rfl: 12   Cholecalciferol (D3 2000 PO), Take 1 capsule by mouth daily. Coconut oil, K1 and D3, Disp: , Rfl:    clindamycin (CLEOCIN T) 1 % SWAB, , Disp: , Rfl:    cyclobenzaprine (FLEXERIL) 10 MG tablet, Take 1 tablet (10 mg total) by mouth at bedtime., Disp: 30 tablet, Rfl: 0   fish oil-omega-3 fatty acids 1000 MG capsule, Take 4 g by mouth daily., Disp: , Rfl:    MAGNESIUM PO, Take 2 tablets by mouth daily., Disp: , Rfl:    metFORMIN (GLUCOPHAGE-XR) 500 MG 24 hr tablet, TAKE 1 TABLETS BY MOUTH DAILY WITH BREAKFAST, Disp: 90 tablet, Rfl: 3   Multiple Vitamins-Minerals (MENS MULTIVITAMIN PO), Take 1 tablet by  mouth daily at 6 (six) AM., Disp: , Rfl:    nitroGLYCERIN (NITROSTAT) 0.4 MG SL tablet, DISSOLVE 1 TABLET UNDER TONGUE FOR CHEST PAIN - IF PAIN REMAINS AFTER 5 MIN, CALL 911 AND REPEAT DOSE. MAX 3 TABLETS IN 15 MINUTES, Disp: 50 tablet, Rfl: 3   ranitidine (ZANTAC) 150 MG capsule, Take 150 mg by mouth daily as needed for heartburn., Disp: , Rfl:    rosuvastatin (CRESTOR) 40 MG tablet, Take 1 tablet (40 mg total) by mouth at bedtime., Disp: 90 tablet, Rfl: 0  No orders of the defined types were placed in this encounter.  There are no Patient Instructions on file for this visit.   --Continue cardiac medications as reconciled in final medication list. --Return in about 6 months (around 07/28/2022) for Follow up, Coronary artery calcification. or sooner if needed. --Continue follow-up with your primary care physician regarding the management of your other chronic comorbid conditions.  Patient's questions and concerns were addressed to his satisfaction. He voices understanding of the instructions provided during this encounter.   This note was created using a voice recognition software as a result there may be grammatical errors inadvertently enclosed that do not reflect the nature of this encounter. Every attempt is made to  correct such errors.  Rex Kras, [place], [facility]  Pager: [phone] Office: [phone]

## 2022-01-26 NOTE — Patient Instructions (Signed)
Ok to decrease the metformin ER 500 mg to 1 tab in the AM  You will be contacted regarding the referral for: colonoscopy  Please continue all other medications as before, and refills have been done if requested.  Please have the pharmacy call with any other refills you may need.  Please continue your efforts at being more active, low cholesterol diet, and weight control.  Please keep your appointments with your specialists as you may have planned  - cardiology today, and ortho soon  You will be contacted regarding the referral for: Sports medicine (though you can stop at the first floor for an appt on your own as well)  Please go to the XRAY Department in the first floor for the x-ray testing  Please go to the LAB at the blood drawing area for the tests to be done  You will be contacted by phone if any changes need to be made immediately.  Otherwise, you will receive a letter about your results with an explanation, but please check with MyChart first.  Please remember to sign up for MyChart if you have not done so, as this will be important to you in the future with finding out test results, communicating by private email, and scheduling acute appointments online when needed.  Please make an Appointment to return in 6 months, or sooner if needed, also with Lab Appointment for testing done 3-5 days before at the [facility] (so this is for TWO appointments - please see the scheduling desk as you leave)

## 2022-01-26 NOTE — Progress Notes (Unsigned)
Patient ID: Jonathan Watkins, male   DOB: 26-Jun-1975, 46 y.o.   MRN: [id number]        Chief Complaint: follow up HTN, HLD and hyperglycemia        HPI:  Jonathan Watkins is a 46 y.o. male here with c/o        True wt about 175 today, has lost significant wt, for unclear reason, has lower appetite, one meal per day, Denies worsening depressive symptoms, suicidal ideation, or panic; has ongoing anxiety, not increased recently.    Wt Readings from Last 3 Encounters:  01/26/22 181 lb (82.1 kg)  10/31/21 186 lb 6.4 oz (84.6 kg)  07/14/21 191 lb 6.4 oz (86.8 kg)   BP Readings from Last 3 Encounters:  01/26/22 124/76  10/31/21 124/77  07/14/21 118/78         Past Medical History:  Diagnosis Date   Abdominal pain, left lower quadrant 10/29/2008   ALLERGIC RHINITIS 10/29/2008   Calcification of native coronary artery    Diabetes mellitus without complication ([place])    ELEVATED BLOOD PRESSURE WITHOUT DIAGNOSIS OF HYPERTENSION 10/22/2007   ERECTILE DYSFUNCTION 01/26/2008   GERD 10/22/2007   Headache(784.0) 05/02/2009   HEMATOCHEZIA 11/30/2008   HYPERLIPIDEMIA 03/21/2007   on meds   Impaired glucose tolerance 03/21/2011   NECK PAIN 12/15/2009   Other dysphagia 05/02/2009   Personal history of colonic polyps 12/28/2008   Renal stones 03/21/2011   SEIZURE DISORDER 03/21/2007   Past Surgical History:  Procedure Laterality Date   ANAL FISSURE REPAIR     COLONOSCOPY  12/2008   RK-F/V-moviprep(exc)-hx of polyps-normal   NASAL FRACTURE SURGERY     nasal fracture sx repair   WISDOM TOOTH EXTRACTION      reports that he has never smoked. He has never used smokeless tobacco. He reports current alcohol use. He reports that he does not use drugs. family history includes Diabetes in his brother, father, and mother; Hyperlipidemia in his brother, father, and mother; Hypertension in his brother; Stroke in his father. Allergies  Allergen Reactions   Percocet [Oxycodone-Acetaminophen] Other (See  Comments)    Constipation    Current Outpatient Medications on File Prior to Visit  Medication Sig Dispense Refill   aspirin EC 81 MG tablet Take 1 tablet (81 mg total) by mouth daily. Swallow whole. 30 tablet 12   clindamycin (CLEOCIN T) 1 % SWAB      cyclobenzaprine (FLEXERIL) 10 MG tablet Take 1 tablet (10 mg total) by mouth at bedtime. 30 tablet 0   fish oil-omega-3 fatty acids 1000 MG capsule Take 4 g by mouth daily.     MAGNESIUM PO Take 2 tablets by mouth daily.     Multiple Vitamins-Minerals (MENS MULTIVITAMIN PO) Take 1 tablet by mouth daily at 6 (six) AM.     nitroGLYCERIN (NITROSTAT) 0.4 MG SL tablet DISSOLVE 1 TABLET UNDER TONGUE FOR CHEST PAIN - IF PAIN REMAINS AFTER 5 MIN, CALL 911 AND REPEAT DOSE. MAX 3 TABLETS IN 15 MINUTES 50 tablet 3   ranitidine (ZANTAC) 150 MG capsule Take 150 mg by mouth daily as needed for heartburn.     rosuvastatin (CRESTOR) 40 MG tablet Take 1 tablet (40 mg total) by mouth at bedtime. 90 tablet 0   No current facility-administered medications on file prior to visit.        ROS:  All others reviewed and negative.  Objective        PE:  BP 124/76 (BP Location: Left Arm, Patient Position:  Sitting, Cuff Size: Large)   Pulse 83   Temp 98.1 F (36.7 C) (Oral)   Ht '5\' 10"'$  (1.778 m)   Wt 181 lb (82.1 kg)   SpO2 96%   BMI 25.97 kg/m                 Constitutional: Pt appears in NAD               HENT: Head: NCAT.                Right Ear: External ear normal.                 Left Ear: External ear normal.                Eyes: . Pupils are equal, round, and reactive to light. Conjunctivae and EOM are normal               Nose: without d/c or deformity               Neck: Neck supple. Gross normal ROM               Cardiovascular: Normal rate and regular rhythm.                 Pulmonary/Chest: Effort normal and breath sounds without rales or wheezing.                Abd:  Soft, NT, ND, + BS, no organomegaly               Neurological: Pt is  alert. At baseline orientation, motor grossly intact               Skin: Skin is warm. No rashes, no other new lesions, LE edema - ***               Psychiatric: Pt behavior is normal without agitation   Micro: none  Cardiac tracings I have personally interpreted today:  none  Pertinent Radiological findings (summarize): none   Lab Results  Component Value Date   WBC 5.0 07/14/2021   HGB 14.9 07/14/2021   HCT 44.4 07/14/2021   PLT 195.0 07/14/2021   GLUCOSE 113 (H) 07/14/2021   CHOL 278 (H) 07/14/2021   TRIG 243.0 (H) 07/14/2021   HDL 46.70 07/14/2021   LDLDIRECT 159.0 07/14/2021   LDLCALC 149 (H) 02/17/2020   ALT 11 07/14/2021   AST 12 07/14/2021   NA 139 07/14/2021   K 5.0 07/14/2021   CL 102 07/14/2021   CREATININE 0.87 07/14/2021   BUN 12 07/14/2021   CO2 32 07/14/2021   TSH 0.71 07/14/2021   PSA 0.27 07/14/2021   HGBA1C 5.9 (A) 01/26/2022   MICROALBUR <0.7 07/14/2021    Assessment/Plan:  Jonathan Watkins is a 46 y.o. Asian [4] male with  has a past medical history of Abdominal pain, left lower quadrant (10/29/2008), ALLERGIC RHINITIS (10/29/2008), Calcification of native coronary artery, Diabetes mellitus without complication ([place]), ELEVATED BLOOD PRESSURE WITHOUT DIAGNOSIS OF HYPERTENSION (10/22/2007), ERECTILE DYSFUNCTION (01/26/2008), GERD (10/22/2007), Headache(784.0) (05/02/2009), HEMATOCHEZIA (11/30/2008), HYPERLIPIDEMIA (03/21/2007), Impaired glucose tolerance (03/21/2011), NECK PAIN (12/15/2009), Other dysphagia (05/02/2009), Personal history of colonic polyps (12/28/2008), Renal stones (03/21/2011), and SEIZURE DISORDER (03/21/2007).  No problem-specific Assessment & Plan notes found for this encounter.  Followup: No follow-ups on file.  [name], MD 01/26/2022 11:30 AM [facility]

## 2022-01-27 ENCOUNTER — Encounter: Payer: Self-pay | Admitting: Internal Medicine

## 2022-01-28 DIAGNOSIS — M7711 Lateral epicondylitis, right elbow: Secondary | ICD-10-CM | POA: Insufficient documentation

## 2022-01-28 DIAGNOSIS — Z1211 Encounter for screening for malignant neoplasm of colon: Secondary | ICD-10-CM | POA: Insufficient documentation

## 2022-01-28 DIAGNOSIS — R634 Abnormal weight loss: Secondary | ICD-10-CM | POA: Insufficient documentation

## 2022-01-28 NOTE — Assessment & Plan Note (Signed)
Last vitamin D Lab Results  Component Value Date   VD25OH 83.09 01/26/2022   Stable, cont oral replacement

## 2022-01-28 NOTE — Assessment & Plan Note (Signed)
Lab Results  Component Value Date   HGBA1C 6.5 01/26/2022   Has been successful with wt los, ok for decreased metformin ER 500 mg - 1 qd

## 2022-01-28 NOTE — Assessment & Plan Note (Signed)
Also for cxr,  to f/u any worsening symptoms or concerns

## 2022-01-28 NOTE — Assessment & Plan Note (Signed)
For colonoscopy as is due 

## 2022-01-28 NOTE — Assessment & Plan Note (Signed)
Mild for volt gel prn, also refer sports medicine for possible cortisone

## 2022-01-29 ENCOUNTER — Ambulatory Visit: Payer: Managed Care, Other (non HMO) | Admitting: Cardiology

## 2022-01-30 NOTE — Progress Notes (Unsigned)
    Benito Mccreedy D.[place] Phone: [phone]   Assessment and Plan:     There are no diagnoses linked to this encounter.  ***   Pertinent previous records reviewed include ***   Follow Up: ***     Subjective:   I, Kaye Mitro, am serving as a Education administrator for Doctor Glennon Mac  Chief Complaint: right elbow pain   HPI:   01/31/2022 Patient is a 46 year old male complaining of right elbow pain. Patient states    Relevant Historical Information: ***  Additional pertinent review of systems negative.   Current Outpatient Medications:    aspirin EC 81 MG tablet, Take 1 tablet (81 mg total) by mouth daily. Swallow whole., Disp: 30 tablet, Rfl: 12   Cholecalciferol (D3 2000 PO), Take 1 capsule by mouth daily. Coconut oil, K1 and D3, Disp: , Rfl:    clindamycin (CLEOCIN T) 1 % SWAB, , Disp: , Rfl:    cyclobenzaprine (FLEXERIL) 10 MG tablet, Take 1 tablet (10 mg total) by mouth at bedtime., Disp: 30 tablet, Rfl: 0   fish oil-omega-3 fatty acids 1000 MG capsule, Take 4 g by mouth daily., Disp: , Rfl:    MAGNESIUM PO, Take 2 tablets by mouth daily., Disp: , Rfl:    metFORMIN (GLUCOPHAGE-XR) 500 MG 24 hr tablet, TAKE 1 TABLETS BY MOUTH DAILY WITH BREAKFAST, Disp: 90 tablet, Rfl: 3   Multiple Vitamins-Minerals (MENS MULTIVITAMIN PO), Take 1 tablet by mouth daily at 6 (six) AM., Disp: , Rfl:    nitroGLYCERIN (NITROSTAT) 0.4 MG SL tablet, DISSOLVE 1 TABLET UNDER TONGUE FOR CHEST PAIN - IF PAIN REMAINS AFTER 5 MIN, CALL 911 AND REPEAT DOSE. MAX 3 TABLETS IN 15 MINUTES, Disp: 50 tablet, Rfl: 3   ranitidine (ZANTAC) 150 MG capsule, Take 150 mg by mouth daily as needed for heartburn., Disp: , Rfl:    rosuvastatin (CRESTOR) 40 MG tablet, Take 1 tablet (40 mg total) by mouth at bedtime., Disp: 90 tablet, Rfl: 0   Objective:     There were no vitals filed for this visit.    There is no height or weight on file  to calculate BMI.    Physical Exam:    ***   Electronically signed by:  Benito Mccreedy D.[facility] 7:49 AM 01/30/22

## 2022-01-31 ENCOUNTER — Ambulatory Visit: Payer: Managed Care, Other (non HMO) | Admitting: Sports Medicine

## 2022-01-31 VITALS — BP 122/80 | HR 69 | Ht 70.0 in | Wt 185.0 lb

## 2022-01-31 DIAGNOSIS — M7711 Lateral epicondylitis, right elbow: Secondary | ICD-10-CM | POA: Diagnosis not present

## 2022-01-31 DIAGNOSIS — M7581 Other shoulder lesions, right shoulder: Secondary | ICD-10-CM

## 2022-01-31 MED ORDER — MELOXICAM 15 MG PO TABS: 15.0000 mg | ORAL_TABLET | Freq: Every day | ORAL | 0 refills | Status: DC

## 2022-01-31 NOTE — Patient Instructions (Addendum)
Good to see you  - Start meloxicam 15 mg daily x2 weeks.  If still having pain after 2 weeks, complete 3rd-week of meloxicam. May use remaining meloxicam as needed once daily for pain control.  Do not to use additional NSAIDs while taking meloxicam.  May use Tylenol [phone] mg 2 to 3 times a day for breakthrough pain. Elbow HEP  Recommend a wrist brace while at work to decrease tension on elbow  Avoid upper extremity work outs for 2 weeks  4 week follow up

## 2022-02-08 ENCOUNTER — Ambulatory Visit: Payer: Managed Care, Other (non HMO) | Admitting: Urology

## 2022-02-08 ENCOUNTER — Encounter: Payer: Self-pay | Admitting: Urology

## 2022-02-08 VITALS — BP 133/78 | HR 73 | Ht 70.0 in | Wt 184.0 lb

## 2022-02-08 DIAGNOSIS — N200 Calculus of kidney: Secondary | ICD-10-CM

## 2022-02-08 DIAGNOSIS — R310 Gross hematuria: Secondary | ICD-10-CM

## 2022-02-08 DIAGNOSIS — R3129 Other microscopic hematuria: Secondary | ICD-10-CM | POA: Diagnosis not present

## 2022-02-08 LAB — URINALYSIS
Bilirubin, UA: NEGATIVE
Glucose, UA: NEGATIVE mg/dL
Ketones, UA: NEGATIVE
Leukocytes, UA: NEGATIVE
Nitrite, UA: NEGATIVE
Protein, UA: NEGATIVE
Spec Grav, UA: 1.015 (ref 1.010–1.025)
Urobilinogen, UA: 0.2 E.U./dL
pH, UA: 7 (ref 5.0–8.0)

## 2022-02-08 MED ORDER — TAMSULOSIN HCL 0.4 MG PO CAPS: 0.4000 mg | ORAL_CAPSULE | Freq: Every day | ORAL | 1 refills | Status: DC

## 2022-02-08 NOTE — Progress Notes (Signed)
Assessment: 1. Microscopic hematuria   2. Gross hematuria   3. Nephrolithiasis     Plan: I had the patient's chart including office notes from 2017 and recent laboratory results. Today I had a discussion with the patient regarding the findings of  gross and microscopic  hematuria including the implications and differential diagnoses associated with it.  I also discussed recommendations for further evaluation including the rationale for upper tract imaging and cystoscopy.  I discussed the nature of these procedures including potential risk and complications.  The patient expressed an understanding of these issues. With his history and current symptoms, nephrolithiasis would be the likely cause of his hematuria. Patient declined pain medication today Strain urine Rx for tamsulosin 0.4 mg daily sent to pharmacy for MET Scheduled for CT hematuria protocol -see if this can be done before the end of the year due to his possible change in insurance.   Chief Complaint:  Chief Complaint  Patient presents with   Hematuria    History of Present Illness:  Jonathan Watkins is a 46 y.o. male who is seen in consultation from Biagio Borg, MD for evaluation of microscopic and gross hematuria.  He reports bilateral low back pain for approximately 1 month.  He noted an episode of gross hematuria approximately 1 week ago after exercising.  This resolved spontaneously.  He was recently seen for his annual physical. U/A from 02/05/22:  TNTC RBC, 0-2 WBC, few bacteria He had an episode of gross hematuria yesterday after exercising.  His back pain is a 5/10.  He is taking over-the-counter ibuprofen for relief.  No fevers, chills, nausea, or vomiting.  No recent imaging. IPSS = 10 today.  He has a history of nephrolithiasis.  He was previously seen at [facility] in June 2017 for a 3 mm distal right ureteral calculus.  His last stone episode was in 2017.   Past Medical History:  Past Medical History:   Diagnosis Date   Abdominal pain, left lower quadrant 10/29/2008   ALLERGIC RHINITIS 10/29/2008   Calcification of native coronary artery    Diabetes mellitus without complication ([place])    ELEVATED BLOOD PRESSURE WITHOUT DIAGNOSIS OF HYPERTENSION 10/22/2007   ERECTILE DYSFUNCTION 01/26/2008   GERD 10/22/2007   Headache(784.0) 05/02/2009   HEMATOCHEZIA 11/30/2008   HYPERLIPIDEMIA 03/21/2007   on meds   Impaired glucose tolerance 03/21/2011   NECK PAIN 12/15/2009   Other dysphagia 05/02/2009   Personal history of colonic polyps 12/28/2008   Renal stones 03/21/2011   SEIZURE DISORDER 03/21/2007    Past Surgical History:  Past Surgical History:  Procedure Laterality Date   ANAL FISSURE REPAIR     COLONOSCOPY  12/2008   RK-F/V-moviprep(exc)-hx of polyps-normal   NASAL FRACTURE SURGERY     nasal fracture sx repair   WISDOM TOOTH EXTRACTION      Allergies:  Allergies  Allergen Reactions   Percocet [Oxycodone-Acetaminophen] Other (See Comments)    Constipation     Family History:  Family History  Problem Relation Age of Onset   Hyperlipidemia Mother    Diabetes Mother    Hyperlipidemia Father    Diabetes Father    Stroke Father    Hypertension Brother    Hyperlipidemia Brother    Diabetes Brother    Colon polyps Neg Hx    Colon cancer Neg Hx    Esophageal cancer Neg Hx    Stomach cancer Neg Hx    Rectal cancer Neg Hx     Social  History:  Social History   Tobacco Use   Smoking status: Never   Smokeless tobacco: Never  Vaping Use   Vaping Use: Never used  Substance Use Topics   Alcohol use: Yes    Comment: 1-2 per month, mixed drink or beer, social   Drug use: No    Review of symptoms:  Constitutional:  Negative for unexplained weight loss, night sweats, fever, chills ENT:  Negative for nose bleeds, sinus pain, painful swallowing CV:  Negative for chest pain, shortness of breath, exercise intolerance, palpitations, loss of consciousness Resp:  Negative  for cough, wheezing, shortness of breath GI:  Negative for nausea, vomiting, diarrhea, bloody stools GU:  Positives noted in HPI; otherwise negative for  urinary incontinence Neuro:  Negative for seizures, poor balance, limb weakness, slurred speech Psych:  Negative for lack of energy, depression, anxiety Endocrine:  Negative for polydipsia, polyuria, symptoms of hypoglycemia (dizziness, hunger, sweating) Hematologic:  Negative for anemia, purpura, petechia, prolonged or excessive bleeding, use of anticoagulants  Allergic:  Negative for difficulty breathing or choking as a result of exposure to anything; no shellfish allergy; no allergic response (rash/itch) to materials, foods  Physical exam: BP 133/78   Pulse 73   Ht _0  (1.778 m)   Wt 184 lb (83.5 kg)   BMI 26.40 kg/m  GENERAL APPEARANCE:  Well appearing, well developed, well nourished, NAD HEENT: Atraumatic, Normocephalic, oropharynx clear. NECK: Supple without lymphadenopathy or thyromegaly. LUNGS: Clear to auscultation bilaterally. HEART: Regular Rate and Rhythm without murmurs, gallops, or rubs. ABDOMEN: Soft, non-tender, No Masses. EXTREMITIES: Moves all extremities well.  Without clubbing, cyanosis, or edema. NEUROLOGIC:  Alert and oriented x 3, normal gait, CN II-XII grossly intact.  MENTAL STATUS:  Appropriate. BACK:  Non-tender to palpation.  No CVAT SKIN:  Warm, dry and intact.    Results: U/A dipstick:  1+ blood, pH 7.0

## 2022-02-09 ENCOUNTER — Telehealth: Payer: Self-pay | Admitting: Urology

## 2022-02-09 NOTE — Telephone Encounter (Signed)
Patient's spouse called & LVM stating that the location for CT SCAN was not approved and she was confused and wondered what that meant or what they could do to expedite the CT SCAN as patient really needs this done & would like a call back regarding this.

## 2022-02-13 ENCOUNTER — Telehealth: Payer: Self-pay | Admitting: Internal Medicine

## 2022-02-13 NOTE — Telephone Encounter (Signed)
Patient is requesting letter from MD to work from home due to health issues. Please return call at [phone].

## 2022-02-14 ENCOUNTER — Telehealth: Payer: Self-pay | Admitting: Urology

## 2022-02-14 NOTE — Telephone Encounter (Signed)
Patient called and LVM that he was wanting to see if he can get a work note for the next 2 weeks stating that he can work from home instead of going to work since his commute is over one hour each way. Patient was last seen on 02/08/22 by Dr. Felipa Eth.

## 2022-02-15 ENCOUNTER — Encounter: Payer: Self-pay | Admitting: Urology

## 2022-02-16 NOTE — Telephone Encounter (Signed)
LVM letting patient know letter is ready to pick up or if he wanted [place] to mail to him, he can call the office back and let us know. A.MUCK

## 2022-02-19 ENCOUNTER — Telehealth: Payer: Self-pay

## 2022-02-19 NOTE — Telephone Encounter (Signed)
Error in pt PA for [employer], was discontinued on 09/23

## 2022-02-19 NOTE — Telephone Encounter (Signed)
Pt PA for   Key: [id number]

## 2022-02-27 ENCOUNTER — Telehealth (HOSPITAL_BASED_OUTPATIENT_CLINIC_OR_DEPARTMENT_OTHER): Payer: Self-pay

## 2022-03-06 ENCOUNTER — Ambulatory Visit: Payer: Managed Care, Other (non HMO) | Admitting: Sports Medicine

## 2022-03-08 ENCOUNTER — Telehealth (HOSPITAL_BASED_OUTPATIENT_CLINIC_OR_DEPARTMENT_OTHER): Payer: Self-pay

## 2022-03-09 ENCOUNTER — Telehealth (HOSPITAL_BASED_OUTPATIENT_CLINIC_OR_DEPARTMENT_OTHER): Payer: Self-pay

## 2022-07-24 ENCOUNTER — Ambulatory Visit: Payer: Managed Care, Other (non HMO) | Admitting: Cardiology

## 2022-08-02 ENCOUNTER — Ambulatory Visit: Payer: Managed Care, Other (non HMO) | Admitting: Cardiology

## 2022-09-03 ENCOUNTER — Other Ambulatory Visit: Payer: Self-pay | Admitting: Internal Medicine

## 2022-09-13 ENCOUNTER — Ambulatory Visit: Payer: Managed Care, Other (non HMO) | Admitting: Cardiology

## 2022-09-14 ENCOUNTER — Other Ambulatory Visit: Payer: Self-pay | Admitting: Internal Medicine

## 2022-10-14 ENCOUNTER — Other Ambulatory Visit: Payer: Self-pay | Admitting: Internal Medicine

## 2022-10-25 ENCOUNTER — Ambulatory Visit: Payer: Managed Care, Other (non HMO) | Admitting: Cardiology

## 2022-10-25 ENCOUNTER — Encounter: Payer: Self-pay | Admitting: Cardiology

## 2022-10-25 VITALS — BP 127/76 | HR 90 | Resp 16 | Ht 70.0 in | Wt 185.4 lb

## 2022-10-25 DIAGNOSIS — E7800 Pure hypercholesterolemia, unspecified: Secondary | ICD-10-CM

## 2022-10-25 DIAGNOSIS — R931 Abnormal findings on diagnostic imaging of heart and coronary circulation: Secondary | ICD-10-CM

## 2022-10-25 DIAGNOSIS — E119 Type 2 diabetes mellitus without complications: Secondary | ICD-10-CM

## 2022-10-25 MED ORDER — EMPAGLIFLOZIN 10 MG PO TABS: 10.0000 mg | ORAL_TABLET | Freq: Every day | ORAL | 1 refills | Status: DC

## 2022-10-25 MED ORDER — ATORVASTATIN CALCIUM 20 MG PO TABS
20.0000 mg | ORAL_TABLET | Freq: Every day | ORAL | 1 refills | Status: DC
Start: 2022-10-25 — End: 2023-01-24

## 2022-10-25 NOTE — Progress Notes (Signed)
Primary Physician/Referring:  Corwin Levins, MD  Patient ID: Jonathan Watkins, male    DOB: 09-17-1975, 47 y.o.   MRN: [id number]  Chief Complaint  Patient presents with   Coronary artery calcification   Follow-up   HPI:    Jonathan Watkins  is a 47 y.o.  Asian [place] male patient with mixed hyperlipidemia, diabetes mellitus, hypercholesteremia who has been evaluated by my partner but wanted to get my opinion as well is accompanied by his wife.  He is asymptomatic, exercises regularly.  Past Medical History:  Diagnosis Date   Abdominal pain, left lower quadrant 10/29/2008   ALLERGIC RHINITIS 10/29/2008   Calcification of native coronary artery    Diabetes mellitus without complication (HCC)    ELEVATED BLOOD PRESSURE WITHOUT DIAGNOSIS OF HYPERTENSION 10/22/2007   ERECTILE DYSFUNCTION 01/26/2008   GERD 10/22/2007   Headache(784.0) 05/02/2009   HEMATOCHEZIA 11/30/2008   HYPERLIPIDEMIA 03/21/2007   on meds   Impaired glucose tolerance 03/21/2011   NECK PAIN 12/15/2009   Other dysphagia 05/02/2009   Personal history of colonic polyps 12/28/2008   Renal stones 03/21/2011   SEIZURE DISORDER 03/21/2007   Past Surgical History:  Procedure Laterality Date   ANAL FISSURE REPAIR     COLONOSCOPY  12/2008   RK-F/V-moviprep(exc)-hx of polyps-normal   NASAL FRACTURE SURGERY     nasal fracture sx repair   WISDOM TOOTH EXTRACTION     Family History  Problem Relation Age of Onset   Hyperlipidemia Mother    Diabetes Mother    Hyperlipidemia Father    Diabetes Father    Stroke Father    Hypertension Brother    Hyperlipidemia Brother    Diabetes Brother    Colon polyps Neg Hx    Colon cancer Neg Hx    Esophageal cancer Neg Hx    Stomach cancer Neg Hx    Rectal cancer Neg Hx     Social History   Tobacco Use   Smoking status: Never   Smokeless tobacco: Never  Substance Use Topics   Alcohol use: Yes    Comment: 1-2 per month, mixed drink or beer, social   Marital Status: Married   ROS  Review of Systems  Cardiovascular:  Negative for chest pain, dyspnea on exertion and leg swelling.   Objective      10/25/2022    4:10 PM 02/08/2022    1:04 PM 01/31/2022    9:04 AM  Vitals with BMI  Height 5\' 10"  5\' 10"  5\' 10"   Weight 185 lbs 6 oz 184 lbs 185 lbs  BMI 26.6 26.4 26.54  Systolic 127 133 409  Diastolic 76 78 80  Pulse 90 73 69   Blood pressure 127/76, pulse 90, resp. rate 16, height 5\' 10"  (1.778 m), weight 185 lb 6.4 oz (84.1 kg), SpO2 98%.  Physical Exam Neck:     Vascular: No carotid bruit or JVD.  Cardiovascular:     Rate and Rhythm: Normal rate and regular rhythm.     Pulses: Intact distal pulses.     Heart sounds: Normal heart sounds. No murmur heard.    No gallop.  Pulmonary:     Effort: Pulmonary effort is normal.     Breath sounds: Normal breath sounds.  Abdominal:     General: Bowel sounds are normal.     Palpations: Abdomen is soft.  Musculoskeletal:     Right lower leg: No edema.     Left lower leg: No edema.    Laboratory examination:  Recent Labs    01/26/22 0944  NA 139  K 4.5  CL 102  CO2 30  GLUCOSE 112*  BUN 15  CREATININE 0.81  CALCIUM 9.9    Lab Results  Component Value Date   GLUCOSE 112 (H) 01/26/2022   NA 139 01/26/2022   K 4.5 01/26/2022   CL 102 01/26/2022   CO2 30 01/26/2022   BUN 15 01/26/2022   CREATININE 0.81 01/26/2022   GFRNONAA >60 07/13/2015   CALCIUM 9.9 01/26/2022   PROT 7.5 01/26/2022   ALBUMIN 5.0 01/26/2022   BILITOT 1.1 01/26/2022   ALKPHOS 43 01/26/2022   AST 14 01/26/2022   ALT 15 01/26/2022   ANIONGAP 11 07/13/2015      Lab Results  Component Value Date   ALT 15 01/26/2022   AST 14 01/26/2022   ALKPHOS 43 01/26/2022   BILITOT 1.1 01/26/2022       Latest Ref Rng & Units 01/26/2022    9:44 AM 07/14/2021   11:42 AM 02/25/2019    1:53 PM  CBC  WBC 4.0 - 10.5 K/uL 6.0  5.0  4.9   Hemoglobin 13.0 - 17.0 g/dL 16.1  09.6  04.5   Hematocrit 39.0 - 52.0 % 44.2  44.4  46.3    Platelets 150.0 - 400.0 K/uL 212.0  195.0  227.0        Latest Ref Rng & Units 01/26/2022    9:44 AM 07/14/2021   11:42 AM 02/17/2020    9:11 AM  Hepatic Function  Total Protein 6.0 - 8.3 g/dL 7.5  7.5  7.6   Albumin 3.5 - 5.2 g/dL 5.0  4.7  4.8   AST 0 - 37 U/L 14  12  17    ALT 0 - 53 U/L 15  11  24    Alk Phosphatase 39 - 117 U/L 43  43  49   Total Bilirubin 0.2 - 1.2 mg/dL 1.1  0.6  0.6   Bilirubin, Direct 0.0 - 0.3 mg/dL 0.2  0.1  0.1     Lipid Panel Recent Labs    01/26/22 0944  CHOL 114  TRIG 69.0  LDLCALC 51  VLDL 13.8  HDL 49.20  CHOLHDL 2    HEMOGLOBIN A1C Lab Results  Component Value Date   HGBA1C 6.5 01/26/2022   TSH Recent Labs    01/26/22 0944  TSH 0.59    Radiology:    Cardiac Studies:   Echocardiogram: 01/19/2022:  Normal LV systolic function with visual EF 60-65%. Left ventricle cavity is normal in size. Normal left ventricular wall thickness. Normal global wall motion. Normal diastolic filling pattern, normal LAP.  No significant valvular heart disease.  No prior study for comparison.    Stress Testing: Exercise Tetrofosmin stress test 11/03/2021: Exercise nuclear stress test was performed using Bruce protocol. Patient reached 9.5 METS, and 87% of age predicted maximum heart rate. Exercise capacity was good. No chest pain reported. Heart rate and hemodynamic response were normal. Stress EKG revealed no ischemic changes. Normal myocardial perfusion. Stress LVEF 54%. Low risk study.   Heart Catheterization: None  Coronary artery calcium score 10/27/2021 Left main: 0 Left anterior descending artery: 300 Left circumflex artery: 134 Right coronary artery: 252   Total score 686 Percentile: 99th when compared to Caucasian age and sex matched peers.  EKG:   EKG 10/25/2022: Normal sinus rhythm at the rate of 76 bpm, normal EKG.  Compared to 10/31/2021, no change.  Medications and allergies   Allergies  Allergen Reactions   Metformin Other  (See Comments)    Bloating   Percocet [Oxycodone-Acetaminophen] Other (See Comments)    Constipation     Medication list   Current Outpatient Medications:    aspirin EC 81 MG tablet, Take 1 tablet (81 mg total) by mouth daily. Swallow whole., Disp: 30 tablet, Rfl: 12   atorvastatin (LIPITOR) 20 MG tablet, Take 1 tablet (20 mg total) by mouth daily., Disp: 90 tablet, Rfl: 1   Cholecalciferol (D3 2000 PO), Take 1 capsule by mouth daily. Coconut oil, K1 and D3, Disp: , Rfl:    empagliflozin (JARDIANCE) 10 MG TABS tablet, Take 1 tablet (10 mg total) by mouth daily before breakfast., Disp: 90 tablet, Rfl: 1   fish oil-omega-3 fatty acids 1000 MG capsule, Take 4 g by mouth daily., Disp: , Rfl:    MAGNESIUM PO, Take 2 tablets by mouth daily., Disp: , Rfl:    Multiple Vitamins-Minerals (MENS MULTIVITAMIN PO), Take 1 tablet by mouth daily at 6 (six) AM., Disp: , Rfl:    nitroGLYCERIN (NITROSTAT) 0.4 MG SL tablet, DISSOLVE 1 TABLET UNDER TONGUE FOR CHEST PAIN - IF PAIN REMAINS AFTER 5 MIN, CALL 911 AND REPEAT DOSE. MAX 3 TABLETS IN 15 MINUTES, Disp: 50 tablet, Rfl: 3   losartan (COZAAR) 25 MG tablet, TAKE 1 TABLET BY MOUTH DAILY, Disp: 90 tablet, Rfl: 3  Assessment     ICD-10-CM   1. Elevated coronary artery calcium score 10/27/21 Total score 686 Percentile: 99th  R93.1 EKG 12-Lead    atorvastatin (LIPITOR) 20 MG tablet    empagliflozin (JARDIANCE) 10 MG TABS tablet    2. Non-insulin dependent type 2 diabetes mellitus (HCC)  E11.9 empagliflozin (JARDIANCE) 10 MG TABS tablet    3. Pure hypercholesterolemia  E78.00 atorvastatin (LIPITOR) 20 MG tablet       Orders Placed This Encounter  Procedures   EKG 12-Lead    Meds ordered this encounter  Medications   atorvastatin (LIPITOR) 20 MG tablet    Sig: Take 1 tablet (20 mg total) by mouth daily.    Dispense:  90 tablet    Refill:  1   empagliflozin (JARDIANCE) 10 MG TABS tablet    Sig: Take 1 tablet (10 mg total) by mouth daily before  breakfast.    Dispense:  90 tablet    Refill:  1    Medications Discontinued During This Encounter  Medication Reason   clindamycin (CLEOCIN T) 1 % SWAB    cyclobenzaprine (FLEXERIL) 10 MG tablet    meloxicam (MOBIC) 15 MG tablet    ranitidine (ZANTAC) 150 MG capsule    tamsulosin (FLOMAX) 0.4 MG CAPS capsule    rosuvastatin (CRESTOR) 40 MG tablet Change in therapy   metFORMIN (GLUCOPHAGE-XR) 500 MG 24 hr tablet Side effect (s)     Recommendations:   Ozie Phou is a 47 y.o. Asian [place] male patient with mixed hyperlipidemia, diabetes mellitus, hypercholesteremia who has been evaluated by my partner but wanted to get my opinion as well is accompanied by his wife.  1. Elevated coronary artery calcium score 10/27/21 Total score 686 Percentile: 99th Patient is an 99 percentile for coronary calcium score and remains asymptomatic and continues to exercise regularly, has lost weight and has maintained weight loss, has been extremely careful with his diet, I reviewed his nuclear stress test in which he achieved 9.5 METS and normal LVEF without ischemia hence continue primary prevention/secondary prevention.  - EKG 12-Lead - atorvastatin (LIPITOR) 20 MG tablet;  Take 1 tablet (20 mg total) by mouth daily.  Dispense: 90 tablet; Refill: 1 - empagliflozin (JARDIANCE) 10 MG TABS tablet; Take 1 tablet (10 mg total) by mouth daily before breakfast.  Dispense: 90 tablet; Refill: 1  2. Non-insulin dependent type 2 diabetes mellitus (HCC) Patient is presently on metformin and has been having some abdominal distention, bloating, will discontinue metformin and switch him to Jardiance 10 mg, he may need 25 mg in view of diabetes mellitus and also for cardiovascular protection but will request his PCP to evaluate further.  - empagliflozin (JARDIANCE) 10 MG TABS tablet; Take 1 tablet (10 mg total) by mouth daily before breakfast.  Dispense: 90 tablet; Refill: 1  3. Pure hypercholesterolemia He is  presently on 5 mg of Crestor, could not tolerate even 10 or 20 mg of Crestor without having myalgias.  Hence we will try Lipitor 20 mg daily.  If lipids are not at goal, goal LDL <55 will add Zetia or both the combination of increasing Lipitor to 40 mg along with Zetia depending upon his tolerance.  I wanted to see him back on a as needed basis as he only needs primary/secondary prevention however upon his request I will see him back in a year, advised him that Dr. Oliver Barre and I communicate and is cardiac and medical issues can be handled via PCP.  Upon his request, I will continue to monitor him at least for now and maybe consider making an PRM on his next office visit.  I will request Dr. Oliver Barre to forward me his labs to me for evaluation as well.   - atorvastatin (LIPITOR) 20 MG tablet; Take 1 tablet (20 mg total) by mouth daily.  Dispense: 90 tablet; Refill: 1     Yates Decamp, MD, [facility] 10/28/2022, 9:07 AM Office: [phone]

## 2022-10-26 ENCOUNTER — Other Ambulatory Visit: Payer: Self-pay

## 2022-10-26 ENCOUNTER — Other Ambulatory Visit: Payer: Self-pay | Admitting: Internal Medicine

## 2022-11-11 ENCOUNTER — Other Ambulatory Visit: Payer: Self-pay | Admitting: Internal Medicine

## 2023-01-24 ENCOUNTER — Other Ambulatory Visit: Payer: Self-pay

## 2023-01-24 DIAGNOSIS — E119 Type 2 diabetes mellitus without complications: Secondary | ICD-10-CM

## 2023-01-24 DIAGNOSIS — R931 Abnormal findings on diagnostic imaging of heart and coronary circulation: Secondary | ICD-10-CM

## 2023-01-24 DIAGNOSIS — E7800 Pure hypercholesterolemia, unspecified: Secondary | ICD-10-CM

## 2023-01-24 MED ORDER — EMPAGLIFLOZIN 10 MG PO TABS: 10.0000 mg | ORAL_TABLET | Freq: Every day | ORAL | 2 refills | Status: DC

## 2023-01-24 MED ORDER — ATORVASTATIN CALCIUM 20 MG PO TABS: 20.0000 mg | ORAL_TABLET | Freq: Every day | ORAL | 2 refills | Status: DC

## 2023-03-11 ENCOUNTER — Other Ambulatory Visit: Payer: Self-pay | Admitting: Internal Medicine

## 2023-04-23 ENCOUNTER — Ambulatory Visit (INDEPENDENT_AMBULATORY_CARE_PROVIDER_SITE_OTHER): Payer: Managed Care, Other (non HMO) | Admitting: Internal Medicine

## 2023-04-23 VITALS — BP 120/78 | HR 80 | Temp 98.1°F | Ht 70.0 in | Wt 190.0 lb

## 2023-04-23 DIAGNOSIS — E1165 Type 2 diabetes mellitus with hyperglycemia: Secondary | ICD-10-CM | POA: Diagnosis not present

## 2023-04-23 DIAGNOSIS — Z7984 Long term (current) use of oral hypoglycemic drugs: Secondary | ICD-10-CM | POA: Diagnosis not present

## 2023-04-23 DIAGNOSIS — E559 Vitamin D deficiency, unspecified: Secondary | ICD-10-CM | POA: Diagnosis not present

## 2023-04-23 DIAGNOSIS — Z125 Encounter for screening for malignant neoplasm of prostate: Secondary | ICD-10-CM | POA: Diagnosis not present

## 2023-04-23 DIAGNOSIS — Z0001 Encounter for general adult medical examination with abnormal findings: Secondary | ICD-10-CM | POA: Diagnosis not present

## 2023-04-23 DIAGNOSIS — Z1211 Encounter for screening for malignant neoplasm of colon: Secondary | ICD-10-CM

## 2023-04-23 DIAGNOSIS — I10 Essential (primary) hypertension: Secondary | ICD-10-CM

## 2023-04-23 DIAGNOSIS — E538 Deficiency of other specified B group vitamins: Secondary | ICD-10-CM

## 2023-04-23 DIAGNOSIS — E611 Iron deficiency: Secondary | ICD-10-CM

## 2023-04-23 DIAGNOSIS — E7800 Pure hypercholesterolemia, unspecified: Secondary | ICD-10-CM

## 2023-04-23 MED ORDER — EMPAGLIFLOZIN 25 MG PO TABS: 25.0000 mg | ORAL_TABLET | Freq: Every day | ORAL | 3 refills | Status: AC

## 2023-04-23 MED ORDER — FREESTYLE LIBRE 3 PLUS SENSOR MISC: 3 refills | Status: AC

## 2023-04-23 NOTE — Progress Notes (Unsigned)
 Patient ID: Jonathan Watkins, male   DOB: Feb 07, 1976, 48 y.o.   MRN: [id number]         Chief Complaint:: wellness exam and dm, htn, hld, low vit d       HPI:  Jonathan Watkins is a 48 y.o. male here for wellness exam; declines covid booster and flu shot, due for colonoscopy, o/w up to date                        Also taking low dose jardiance 10 mg per day.  Only taking half lipitor due to constipation.  Pt denies chest pain, increased sob or doe, wheezing, orthopnea, PND, increased LE swelling, palpitations, dizziness or syncope.   Pt denies polydipsia, polyuria, or new focal neuro s/s.    Pt denies fever, wt loss, night sweats, loss of appetite, or other constitutional symptoms       Wt Readings from Last 3 Encounters:  04/23/23 190 lb (86.2 kg)  10/25/22 185 lb 6.4 oz (84.1 kg)  02/08/22 184 lb (83.5 kg)   BP Readings from Last 3 Encounters:  04/23/23 120/78  10/25/22 127/76  02/08/22 133/78   Immunization History  Administered Date(s) Administered   Hepatitis A, Adult 10/17/2017   Hepb-cpg 10/17/2017, 11/21/2017   Influenza Whole 12/12/2007, 11/10/2008   Influenza, High Dose Seasonal PF 12/14/2018   Influenza, Quadrivalent, Recombinant, Inj, Pf 11/22/2016   Influenza,inj,Quad PF,6+ Mos 11/27/2012, 11/18/2013, 12/16/2014, 12/29/2015, 11/21/2017   Influenza-Unspecified 11/23/2016, 11/23/2019   PFIZER(Purple Top)SARS-COV-2 Vaccination 04/22/2019, 05/13/2019, 11/30/2019   Pneumococcal Conjugate-13 12/16/2014   Pneumococcal Polysaccharide-23 01/10/2017, 02/28/2017   Td 11/10/2008   Tdap 02/28/2017, 03/17/2018   Health Maintenance Due  Topic Date Due   Colonoscopy  02/15/2020   HEMOGLOBIN A1C  07/28/2022   COVID-19 Vaccine (4 - 2024-25 season) 10/14/2022   Diabetic kidney evaluation - eGFR measurement  01/27/2023   Diabetic kidney evaluation - Urine ACR  01/27/2023      Past Medical History:  Diagnosis Date   Abdominal pain, left lower quadrant 10/29/2008   ALLERGIC RHINITIS  10/29/2008   Calcification of native coronary artery    Diabetes mellitus without complication (HCC)    ELEVATED BLOOD PRESSURE WITHOUT DIAGNOSIS OF HYPERTENSION 10/22/2007   ERECTILE DYSFUNCTION 01/26/2008   GERD 10/22/2007   Headache(784.0) 05/02/2009   HEMATOCHEZIA 11/30/2008   HYPERLIPIDEMIA 03/21/2007   on meds   Impaired glucose tolerance 03/21/2011   NECK PAIN 12/15/2009   Other dysphagia 05/02/2009   Personal history of colonic polyps 12/28/2008   Renal stones 03/21/2011   SEIZURE DISORDER 03/21/2007   Past Surgical History:  Procedure Laterality Date   ANAL FISSURE REPAIR     COLONOSCOPY  12/2008   RK-F/V-moviprep(exc)-hx of polyps-normal   NASAL FRACTURE SURGERY     nasal fracture sx repair   WISDOM TOOTH EXTRACTION      reports that he has never smoked. He has never used smokeless tobacco. He reports current alcohol use. He reports that he does not use drugs. family history includes Diabetes in his brother, father, and mother; Hyperlipidemia in his brother, father, and mother; Hypertension in his brother; Stroke in his father. Allergies  Allergen Reactions   Metformin Other (See Comments)    Bloating   Percocet [Oxycodone-Acetaminophen] Other (See Comments)    Constipation    Current Outpatient Medications on File Prior to Visit  Medication Sig Dispense Refill   aspirin EC 81 MG tablet Take 1 tablet (81 mg total) by mouth  daily. Swallow whole. 30 tablet 12   atorvastatin (LIPITOR) 20 MG tablet Take 1 tablet (20 mg total) by mouth daily. 90 tablet 2   Cholecalciferol (D3 2000 PO) Take 1 capsule by mouth daily. Coconut oil, K1 and D3     fish oil-omega-3 fatty acids 1000 MG capsule Take 4 g by mouth daily.     losartan (COZAAR) 25 MG tablet TAKE 1 TABLET BY MOUTH DAILY 90 tablet 3   MAGNESIUM PO Take 2 tablets by mouth daily.     Multiple Vitamins-Minerals (MENS MULTIVITAMIN PO) Take 1 tablet by mouth daily at 6 (six) AM.     nitroGLYCERIN (NITROSTAT) 0.4 MG SL  tablet DISSOLVE 1 TABLET UNDER TONGUE FOR CHEST PAIN - IF PAIN REMAINS AFTER 5 MIN, CALL 911 AND REPEAT DOSE. MAX 3 TABLETS IN 15 MINUTES 50 tablet 3   No current facility-administered medications on file prior to visit.        ROS:  All others reviewed and negative.  Objective        PE:  BP 120/78 (BP Location: Left Arm, Patient Position: Sitting, Cuff Size: Normal)   Pulse 80   Temp 98.1 F (36.7 C) (Oral)   Ht 5\' 10"  (1.778 m)   Wt 190 lb (86.2 kg)   SpO2 99%   BMI 27.26 kg/m                 Constitutional: Pt appears in NAD               HENT: Head: NCAT.                Right Ear: External ear normal.                 Left Ear: External ear normal.                Eyes: . Pupils are equal, round, and reactive to light. Conjunctivae and EOM are normal               Nose: without d/c or deformity               Neck: Neck supple. Gross normal ROM               Cardiovascular: Normal rate and regular rhythm.                 Pulmonary/Chest: Effort normal and breath sounds without rales or wheezing.                Abd:  Soft, NT, ND, + BS, no organomegaly               Neurological: Pt is alert. At baseline orientation, motor grossly intact               Skin: Skin is warm. No rashes, no other new lesions, LE edema - none               Psychiatric: Pt behavior is normal without agitation   Micro: none  Cardiac tracings I have personally interpreted today:  none  Pertinent Radiological findings (summarize): none   Lab Results  Component Value Date   WBC 6.0 01/26/2022   HGB 14.7 01/26/2022   HCT 44.2 01/26/2022   PLT 212.0 01/26/2022   GLUCOSE 112 (H) 01/26/2022   CHOL 114 01/26/2022   TRIG 69.0 01/26/2022   HDL 49.20 01/26/2022   LDLDIRECT 159.0 07/14/2021   LDLCALC 51 01/26/2022  ALT 15 01/26/2022   AST 14 01/26/2022   NA 139 01/26/2022   K 4.5 01/26/2022   CL 102 01/26/2022   CREATININE 0.81 01/26/2022   BUN 15 01/26/2022   CO2 30 01/26/2022   TSH 0.59  01/26/2022   PSA 0.30 01/26/2022   HGBA1C 6.5 01/26/2022   MICROALBUR 3.1 (H) 01/26/2022   Assessment/Plan:  Jonathan Watkins is a 48 y.o. Asian [4] male with  has a past medical history of Abdominal pain, left lower quadrant (10/29/2008), ALLERGIC RHINITIS (10/29/2008), Calcification of native coronary artery, Diabetes mellitus without complication (HCC), ELEVATED BLOOD PRESSURE WITHOUT DIAGNOSIS OF HYPERTENSION (10/22/2007), ERECTILE DYSFUNCTION (01/26/2008), GERD (10/22/2007), Headache(784.0) (05/02/2009), HEMATOCHEZIA (11/30/2008), HYPERLIPIDEMIA (03/21/2007), Impaired glucose tolerance (03/21/2011), NECK PAIN (12/15/2009), Other dysphagia (05/02/2009), Personal history of colonic polyps (12/28/2008), Renal stones (03/21/2011), and SEIZURE DISORDER (03/21/2007).  Encounter for well adult exam with abnormal findings Age and sex appropriate education and counseling updated with regular exercise and diet Referrals for preventative services - none needed Immunizations addressed - declines covid booster, flu shot Smoking counseling  - none needed Evidence for depression or other mood disorder - none significant Most recent labs reviewed. I have personally reviewed and have noted: 1) the patient's medical and social history 2) The patient's current medications and supplements 3) The patient's height, weight, and BMI have been recorded in the chart   Diabetes [facility]) Lab Results  Component Value Date   HGBA1C 6.5 01/26/2022   uncontrolled pt for increased jardiance 25 mg qd   HTN (hypertension) BP Readings from Last 3 Encounters:  04/23/23 120/78  10/25/22 127/76  02/08/22 133/78   Stable, pt to continue medical treatment losartan 25 mg qd   Hyperlipidemia Lab Results  Component Value Date   LDLCALC 51 01/26/2022   Stable, pt to continue current statin lipitor 20 mg qd   Vitamin D deficiency Last vitamin D Lab Results  Component Value Date   VD25OH 83.09 01/26/2022    Stable, cont oral replacement   Screen for colon cancer For colonoscopy as is due  Followup: Return in about 6 months (around 10/24/2023).  Oliver Barre, MD 04/24/2023 10:00 PM [facility]

## 2023-04-23 NOTE — Patient Instructions (Signed)
 Ok to increase the jardiance to 25 mg per day  Please continue all other medications as before, and refills have been done if requested - Libre 3 plus sensors  Please have the pharmacy call with any other refills you may need.  Please continue your efforts at being more active, low cholesterol diet, and weight control.  You are otherwise up to date with prevention measures today.  Please keep your appointments with your specialists as you may have planned  You will be contacted regarding the referral for: colonoscopy  Please go to the LAB at the blood drawing area for the tests to be done - tomorrow am  You will be contacted by phone if any changes need to be made immediately.  Otherwise, you will receive a letter about your results with an explanation, but please check with MyChart first.  Please make an Appointment to return in 6 months, or sooner if needed

## 2023-04-24 ENCOUNTER — Other Ambulatory Visit

## 2023-04-24 ENCOUNTER — Encounter: Payer: Self-pay | Admitting: Internal Medicine

## 2023-04-24 DIAGNOSIS — E611 Iron deficiency: Secondary | ICD-10-CM

## 2023-04-24 DIAGNOSIS — Z125 Encounter for screening for malignant neoplasm of prostate: Secondary | ICD-10-CM

## 2023-04-24 DIAGNOSIS — E538 Deficiency of other specified B group vitamins: Secondary | ICD-10-CM

## 2023-04-24 DIAGNOSIS — E559 Vitamin D deficiency, unspecified: Secondary | ICD-10-CM | POA: Diagnosis not present

## 2023-04-24 DIAGNOSIS — E1165 Type 2 diabetes mellitus with hyperglycemia: Secondary | ICD-10-CM | POA: Diagnosis not present

## 2023-04-24 LAB — BASIC METABOLIC PANEL
BUN: 17 mg/dL (ref 6–23)
CO2: 31 meq/L (ref 19–32)
Calcium: 9.7 mg/dL (ref 8.4–10.5)
Chloride: 101 meq/L (ref 96–112)
Creatinine, Ser: 0.93 mg/dL (ref 0.40–1.50)
GFR: 97.32 mL/min (ref >60.00)
Glucose, Bld: 131 mg/dL — ABNORMAL HIGH (ref 70–99)
Potassium: 4.9 meq/L (ref 3.5–5.1)
Sodium: 139 meq/L (ref 135–145)

## 2023-04-24 LAB — URINALYSIS, ROUTINE W REFLEX MICROSCOPIC
Bilirubin Urine: NEGATIVE
Hgb urine dipstick: NEGATIVE
Ketones, ur: NEGATIVE
Leukocytes,Ua: NEGATIVE
Nitrite: NEGATIVE
RBC / HPF: NONE SEEN (ref 0–?)
Specific Gravity, Urine: 1.015 (ref 1.000–1.030)
Total Protein, Urine: NEGATIVE
Urine Glucose: >=1000 — AB
Urobilinogen, UA: 0.2 (ref 0.0–1.0)
pH: 6 (ref 5.0–8.0)

## 2023-04-24 LAB — CBC WITH DIFFERENTIAL/PLATELET
Basophils Absolute: 0.1 10*3/uL (ref 0.0–0.1)
Basophils Relative: 1.1 % (ref 0.0–3.0)
Eosinophils Absolute: 0.3 10*3/uL (ref 0.0–0.7)
Eosinophils Relative: 6.6 % — ABNORMAL HIGH (ref 0.0–5.0)
HCT: 47.7 % (ref 39.0–52.0)
Hemoglobin: 15.8 g/dL (ref 13.0–17.0)
Lymphocytes Relative: 31.7 % (ref 12.0–46.0)
Lymphs Abs: 1.7 10*3/uL (ref 0.7–4.0)
MCHC: 33.1 g/dL (ref 30.0–36.0)
MCV: 90.1 fl (ref 78.0–100.0)
Monocytes Absolute: 0.4 10*3/uL (ref 0.1–1.0)
Monocytes Relative: 6.8 % (ref 3.0–12.0)
Neutro Abs: 2.8 10*3/uL (ref 1.4–7.7)
Neutrophils Relative %: 53.8 % (ref 43.0–77.0)
Platelets: 180 10*3/uL (ref 150.0–400.0)
RBC: 5.3 Mil/uL (ref 4.22–5.81)
RDW: 14.2 % (ref 11.5–15.5)
WBC: 5.2 10*3/uL (ref 4.0–10.5)

## 2023-04-24 LAB — LIPID PANEL
Cholesterol: 198 mg/dL (ref 0–200)
HDL: 43.7 mg/dL (ref >39.00)
LDL Cholesterol: 101 mg/dL — ABNORMAL HIGH (ref 0–99)
NonHDL: 154.79
Total CHOL/HDL Ratio: 5
Triglycerides: 269 mg/dL — ABNORMAL HIGH (ref 0.0–149.0)
VLDL: 53.8 mg/dL — ABNORMAL HIGH (ref 0.0–40.0)

## 2023-04-24 LAB — TSH: TSH: 0.64 u[IU]/mL (ref 0.35–5.50)

## 2023-04-24 LAB — IBC PANEL
Iron: 48 ug/dL (ref 42–165)
Saturation Ratios: 11 % — ABNORMAL LOW (ref 20.0–50.0)
TIBC: 435.4 ug/dL (ref 250.0–450.0)
Transferrin: 311 mg/dL (ref 212.0–360.0)

## 2023-04-24 LAB — PSA: PSA: 0.18 ng/mL (ref 0.10–4.00)

## 2023-04-24 LAB — HEPATIC FUNCTION PANEL
ALT: 12 U/L (ref 0–53)
AST: 15 U/L (ref 0–37)
Albumin: 4.6 g/dL (ref 3.5–5.2)
Alkaline Phosphatase: 48 U/L (ref 39–117)
Bilirubin, Direct: 0.1 mg/dL (ref 0.0–0.3)
Total Bilirubin: 0.5 mg/dL (ref 0.2–1.2)
Total Protein: 7.1 g/dL (ref 6.0–8.3)

## 2023-04-24 LAB — VITAMIN D 25 HYDROXY (VIT D DEFICIENCY, FRACTURES): VITD: 65.85 ng/mL (ref 30.00–100.00)

## 2023-04-24 LAB — HEMOGLOBIN A1C: Hgb A1c MFr Bld: 7 % — ABNORMAL HIGH (ref 4.6–6.5)

## 2023-04-24 LAB — VITAMIN B12: Vitamin B-12: 448 pg/mL (ref 211–911)

## 2023-04-24 LAB — FERRITIN: Ferritin: 23.3 ng/mL (ref 22.0–322.0)

## 2023-04-24 NOTE — Assessment & Plan Note (Signed)
For colonoscopy as is due 

## 2023-04-24 NOTE — Assessment & Plan Note (Signed)
 Lab Results  Component Value Date   LDLCALC 51 01/26/2022   Stable, pt to continue current statin lipitor 20 mg qd

## 2023-04-24 NOTE — Assessment & Plan Note (Signed)
 Age and sex appropriate education and counseling updated with regular exercise and diet Referrals for preventative services - none needed Immunizations addressed - declines covid booster, flu shot Smoking counseling  - none needed Evidence for depression or other mood disorder - none significant Most recent labs reviewed. I have personally reviewed and have noted: 1) the patient's medical and social history 2) The patient's current medications and supplements 3) The patient's height, weight, and BMI have been recorded in the chart

## 2023-04-24 NOTE — Assessment & Plan Note (Signed)
Last vitamin D Lab Results  Component Value Date   VD25OH 83.09 01/26/2022   Stable, cont oral replacement

## 2023-04-24 NOTE — Assessment & Plan Note (Signed)
 Lab Results  Component Value Date   HGBA1C 6.5 01/26/2022   uncontrolled pt for increased jardiance 25 mg qd

## 2023-04-24 NOTE — Assessment & Plan Note (Signed)
 BP Readings from Last 3 Encounters:  04/23/23 120/78  10/25/22 127/76  02/08/22 133/78   Stable, pt to continue medical treatment losartan 25 mg qd

## 2023-04-25 ENCOUNTER — Encounter: Payer: Self-pay | Admitting: Internal Medicine

## 2023-04-25 ENCOUNTER — Other Ambulatory Visit: Payer: Self-pay | Admitting: Internal Medicine

## 2023-04-25 DIAGNOSIS — R931 Abnormal findings on diagnostic imaging of heart and coronary circulation: Secondary | ICD-10-CM

## 2023-04-25 DIAGNOSIS — E7800 Pure hypercholesterolemia, unspecified: Secondary | ICD-10-CM

## 2023-04-25 LAB — MICROALBUMIN / CREATININE URINE RATIO
Creatinine,U: 82.6 mg/dL
Microalb Creat Ratio: 8.5 mg/g (ref 0.0–30.0)
Microalb, Ur: <0.7 mg/dL

## 2023-04-25 MED ORDER — ATORVASTATIN CALCIUM 20 MG PO TABS: 20.0000 mg | ORAL_TABLET | Freq: Every day | ORAL | 3 refills | Status: DC

## 2023-05-21 ENCOUNTER — Other Ambulatory Visit: Payer: Self-pay | Admitting: Internal Medicine

## 2023-10-11 ENCOUNTER — Other Ambulatory Visit: Payer: Self-pay | Admitting: Internal Medicine

## 2024-01-30 LAB — OPHTHALMOLOGY REPORT-SCANNED

## 2024-02-26 NOTE — Progress Notes (Signed)
 "     Ellouise Console, PA-C [address] [place], [place]  [address] Phone: [phone]   Gastroenterology Consultation  Referring Provider:     Norleen Lynwood ORN, MD Primary Care Physician:  Norleen Lynwood ORN, MD Primary Gastroenterologist:  Ellouise Console, PA-C / Dr. Gordy Starch  Reason for Consultation:    Discuss repeat colonoscopy        HPI:   Discussed the use of AI scribe software for clinical note transcription with the patient, who gave verbal consent to proceed.  12/2008 last colonoscopy by Dr. Debrah (to evaluate rectal bleeding and history of adenomatous colon polyps): Excellent prep.  Internal hemorrhoids.  Otherwise normal.  No polyps.  Colonoscopy in [facility] maybe [id number] had a colon polyp.  Patient was scheduled for repeat colonoscopy in 2022 but had covid had to cancel.  He denies any GI symptoms at this time.  History of Present Illness Jonathan Watkins is a 49 year old male with a history of colonic polyp who presents for routine colorectal cancer screening.  Colorectal Cancer Screening: - Presents for routine colonoscopy screening - No current gastrointestinal symptoms or concerns - No family history of colon cancer  Seizure Disorder: - History of epilepsy - No seizures since 1996 - No antiepileptic medication required since 1999-2000  Medication Use: - Takes baby aspirin  - No use of other blood thinners  PMH: Diabetes, HTN, GERD, history of colon polyps, seizure disorder.  Past Medical History:  Diagnosis Date   Abdominal pain, left lower quadrant 10/29/2008   ALLERGIC RHINITIS 10/29/2008   Calcification of native coronary artery    Diabetes mellitus without complication (HCC)    ELEVATED BLOOD PRESSURE WITHOUT DIAGNOSIS OF HYPERTENSION 10/22/2007   ERECTILE DYSFUNCTION 01/26/2008   GERD 10/22/2007   Headache(784.0) 05/02/2009   HEMATOCHEZIA 11/30/2008   HYPERLIPIDEMIA 03/21/2007   on meds   Impaired glucose tolerance 03/21/2011   NECK PAIN 12/15/2009    Other dysphagia 05/02/2009   Personal history of colonic polyps 12/28/2008   Renal stones 03/21/2011   SEIZURE DISORDER 03/21/2007    Past Surgical History:  Procedure Laterality Date   ANAL FISSURE REPAIR     COLONOSCOPY  12/2008   RK-F/V-moviprep(exc)-hx of polyps-normal   NASAL FRACTURE SURGERY     nasal fracture sx repair   WISDOM TOOTH EXTRACTION      Prior to Admission medications  Medication Sig Start Date End Date Taking? Authorizing Provider  aspirin  EC 81 MG tablet Take 1 tablet (81 mg total) by mouth daily. Swallow whole. 10/28/21   Norleen Lynwood ORN, MD  atorvastatin  (LIPITOR) 20 MG tablet Take 1 tablet (20 mg total) by mouth daily. 04/25/23 07/24/23  Norleen Lynwood ORN, MD  Cholecalciferol (D3 2000 PO) Take 1 capsule by mouth daily. Coconut oil, K1 and D3    [provider]  Continuous Glucose Sensor (FREESTYLE LIBRE 3 PLUS SENSOR) MISC Change sensor every 15 days. E11.9 04/23/23   Norleen Lynwood ORN, MD  empagliflozin  (JARDIANCE ) 25 MG TABS tablet Take 1 tablet (25 mg total) by mouth daily before breakfast. 04/23/23   Norleen Lynwood ORN, MD  fish oil-omega-3 fatty acids 1000 MG capsule Take 4 g by mouth daily.    [provider]  losartan  (COZAAR ) 25 MG tablet TAKE 1 TABLET BY MOUTH DAILY 10/15/23   Norleen Lynwood ORN, MD  MAGNESIUM PO Take 2 tablets by mouth daily.    [provider]  Multiple Vitamins-Minerals (MENS MULTIVITAMIN PO) Take 1 tablet by mouth daily at 6 (  six) AM.    [provider]  nitroGLYCERIN  (NITROSTAT ) 0.4 MG SL tablet DISSOLVE 1 TABLET UNDER TONGUE FOR CHEST PAIN - IF PAIN REMAINS AFTER 5 MIN, CALL 911 AND REPEAT DOSE. MAX 3 TABLETS IN 15 MINUTES 11/27/21   Michele Richardson, DO    Family History  Problem Relation Age of Onset   Hyperlipidemia Mother    Diabetes Mother    Hyperlipidemia Father    Diabetes Father    Stroke Father    Hypertension Brother    Hyperlipidemia Brother    Diabetes Brother    Colon polyps Neg Hx    Colon cancer Neg  Hx    Esophageal cancer Neg Hx    Stomach cancer Neg Hx    Rectal cancer Neg Hx      Social History[1]  Allergies as of 02/27/2024 - Review Complete 02/27/2024  Allergen Reaction Noted   Metformin  Other (See Comments) 10/28/2022   Percocet [oxycodone -acetaminophen ] Other (See Comments) 10/31/2021    Review of Systems:    All systems reviewed and negative except where noted in HPI.   Physical Exam:  BP 120/72   Pulse 69   Ht 5' 10 (1.778 m)   Wt 188 lb (85.3 kg)   BMI 26.98 kg/m  No LMP for male patient.  General:   Alert,  Well-developed, well-nourished, pleasant and cooperative in NAD Lungs:  Respirations even and unlabored.  Clear throughout to auscultation.   No wheezes, crackles, or rhonchi. No acute distress. Heart:  Regular rate and rhythm; no murmurs, clicks, rubs, or gallops. Abdomen:  Normal bowel sounds.  No bruits.  Soft, and non-distended without masses, hepatosplenomegaly or hernias noted.  No Tenderness.  No guarding or rebound tenderness.    Neurologic:  Alert and oriented x3;  grossly normal neurologically. Psych:  Alert and cooperative. Normal mood and affect.   Imaging Studies: No results found.  Labs: CBC    Component Value Date/Time   WBC 5.2 04/24/2023 0812   RBC 5.30 04/24/2023 0812   HGB 15.8 04/24/2023 0812   HCT 47.7 04/24/2023 0812   PLT 180.0 04/24/2023 0812   MCV 90.1 04/24/2023 0812    CMP     Component Value Date/Time   NA 139 04/24/2023 0812   K 4.9 04/24/2023 0812   CL 101 04/24/2023 0812   CO2 31 04/24/2023 0812   GLUCOSE 131 (H) 04/24/2023 0812   BUN 17 04/24/2023 0812   CREATININE 0.93 04/24/2023 0812   CALCIUM  9.7 04/24/2023 0812   PROT 7.1 04/24/2023 0812   ALBUMIN 4.6 04/24/2023 0812   AST 15 04/24/2023 0812   ALT 12 04/24/2023 0812   ALKPHOS 48 04/24/2023 0812   BILITOT 0.5 04/24/2023 0812   GFRNONAA >60 07/13/2015 0946   GFRAA >60 07/13/2015 0946    Assessment and Plan:   Jonathan Watkins is a 49 y.o. y/o  male has been referred for : Assessment & Plan Colorectal cancer screening Asymptomatic, average risk, due for repeat screening. No contraindications to colonoscopy. - Scheduling Colonoscopy I discussed risks of colonoscopy with patient to include risk of bleeding, colon perforation, and risk of sedation.  Patient expressed understanding and agrees to proceed with colonoscopy.   Follow up based on colonoscopy results.  Ellouise Console, PA-C       [1]  Social History Tobacco Use   Smoking status: Never   Smokeless tobacco: Never  Vaping Use   Vaping status: Never Used  Substance Use Topics   Alcohol use: Yes  Comment: 1-2 per month, mixed drink or beer, social   Drug use: No   "

## 2024-02-27 ENCOUNTER — Ambulatory Visit: Payer: Self-pay | Admitting: Physician Assistant

## 2024-02-27 ENCOUNTER — Encounter: Payer: Self-pay | Admitting: Physician Assistant

## 2024-02-27 ENCOUNTER — Encounter: Payer: Self-pay | Admitting: Internal Medicine

## 2024-02-27 VITALS — BP 120/72 | HR 69 | Ht 70.0 in | Wt 188.0 lb

## 2024-02-27 DIAGNOSIS — Z1211 Encounter for screening for malignant neoplasm of colon: Secondary | ICD-10-CM

## 2024-02-27 DIAGNOSIS — Z01818 Encounter for other preprocedural examination: Secondary | ICD-10-CM | POA: Diagnosis not present

## 2024-02-27 NOTE — Patient Instructions (Addendum)
 It has been recommended to you by your physician that you have a colonoscopy completed. Per your request, we did not schedule the procedure today. Please contact our office at [phone] should you decide to have the procedure completed. You will be scheduled for a pre-visit and procedure at that time.   Thank you for trusting me with your gastrointestinal care!   Ellouise Console, PA-C  _______________________________________________________  If your blood pressure at your visit was 140/90 or greater, please contact your primary care physician to follow up on this.  _______________________________________________________  If you are age 61 or older, your body mass index should be between 23-30. Your Body mass index is 26.98 kg/m. If this is out of the aforementioned range listed, please consider follow up with your Primary Care Provider.  If you are age 20 or younger, your body mass index should be between 19-25. Your Body mass index is 26.98 kg/m. If this is out of the aformentioned range listed, please consider follow up with your Primary Care Provider.   ________________________________________________________  The [facility] providers would like to encourage you to use MYCHART to communicate with providers for non-urgent requests or questions.  Due to long hold times on the telephone, sending your provider a message by [facility] may be a faster and more efficient way to get a response.  Please allow 48 business hours for a response.  Please remember that this is for non-urgent requests.  _______________________________________________________  [facility] is using a team-based approach to care.  Your team is made up of your doctor and two to three APPS. Our APPS (Nurse Practitioners and Physician Assistants) work with your physician to ensure care continuity for you. They are fully qualified to address your health concerns and develop a treatment plan. They communicate directly  with your gastroenterologist to care for you. Seeing the Advanced Practice Practitioners on your physician's team can help you by facilitating care more promptly, often allowing for earlier appointments, access to diagnostic testing, procedures, and other specialty referrals.

## 2024-03-25 ENCOUNTER — Encounter

## 2024-04-08 ENCOUNTER — Encounter: Admitting: Internal Medicine
